# Patient Record
Sex: Female | Born: 1955 | Race: Black or African American | Hispanic: No | Marital: Single | Smoking: Never smoker
Health system: Southern US, Community
[De-identification: ages and names within clinical notes are randomized; demographics above are authoritative.]

---

## 2008-08-02 ENCOUNTER — Emergency Department (HOSPITAL_COMMUNITY): Admission: EM | Admit: 2008-08-02 | Discharge: 2008-08-02 | Payer: Self-pay | Admitting: Emergency Medicine

## 2011-06-13 LAB — BASIC METABOLIC PANEL
Calcium: 8.8
GFR calc Af Amer: >60
GFR calc non Af Amer: >60

## 2011-06-13 LAB — CBC
HCT: 26.7 — ABNORMAL LOW
Hemoglobin: 8.2 — ABNORMAL LOW
MCV: 59.8 — ABNORMAL LOW
RBC: 4.46
WBC: 4.9

## 2011-06-13 LAB — TYPE AND SCREEN
ABO/RH(D): O POS
Antibody Screen: NEGATIVE

## 2011-06-13 LAB — DIFFERENTIAL
Band Neutrophils: 0
Basophils Absolute: 0
Basophils Relative: 0
Blasts: 0
Eosinophils Absolute: 0.3
Monocytes Relative: 8
Promyelocytes Absolute: 0

## 2011-06-13 LAB — PROTIME-INR: Prothrombin Time: 35.1 — ABNORMAL HIGH

## 2019-12-19 ENCOUNTER — Encounter (HOSPITAL_COMMUNITY): Payer: Self-pay

## 2019-12-19 ENCOUNTER — Other Ambulatory Visit: Payer: Self-pay

## 2019-12-19 ENCOUNTER — Emergency Department (HOSPITAL_COMMUNITY): Payer: Medicare HMO

## 2019-12-19 ENCOUNTER — Inpatient Hospital Stay (HOSPITAL_COMMUNITY): Payer: Medicare HMO

## 2019-12-19 ENCOUNTER — Inpatient Hospital Stay (HOSPITAL_COMMUNITY)
Admission: EM | Admit: 2019-12-19 | Discharge: 2019-12-26 | DRG: 083 | Discharge status: Against Medical Advice | Payer: Medicare HMO | Attending: Family Medicine | Admitting: Family Medicine

## 2019-12-19 DIAGNOSIS — D6852 Prothrombin gene mutation: Secondary | ICD-10-CM | POA: Diagnosis present

## 2019-12-19 DIAGNOSIS — J9611 Chronic respiratory failure with hypoxia: Secondary | ICD-10-CM | POA: Diagnosis present

## 2019-12-19 DIAGNOSIS — Z7901 Long term (current) use of anticoagulants: Secondary | ICD-10-CM | POA: Diagnosis not present

## 2019-12-19 DIAGNOSIS — I270 Primary pulmonary hypertension: Secondary | ICD-10-CM | POA: Diagnosis present

## 2019-12-19 DIAGNOSIS — Z9981 Dependence on supplemental oxygen: Secondary | ICD-10-CM | POA: Diagnosis not present

## 2019-12-19 DIAGNOSIS — Z5329 Procedure and treatment not carried out because of patient's decision for other reasons: Secondary | ICD-10-CM | POA: Diagnosis present

## 2019-12-19 DIAGNOSIS — R451 Restlessness and agitation: Secondary | ICD-10-CM | POA: Diagnosis not present

## 2019-12-19 DIAGNOSIS — R4701 Aphasia: Secondary | ICD-10-CM | POA: Diagnosis not present

## 2019-12-19 DIAGNOSIS — R471 Dysarthria and anarthria: Secondary | ICD-10-CM | POA: Diagnosis present

## 2019-12-19 DIAGNOSIS — W19XXXA Unspecified fall, initial encounter: Secondary | ICD-10-CM | POA: Diagnosis present

## 2019-12-19 DIAGNOSIS — Z87891 Personal history of nicotine dependence: Secondary | ICD-10-CM | POA: Diagnosis not present

## 2019-12-19 DIAGNOSIS — D6859 Other primary thrombophilia: Secondary | ICD-10-CM | POA: Diagnosis present

## 2019-12-19 DIAGNOSIS — G4700 Insomnia, unspecified: Secondary | ICD-10-CM | POA: Diagnosis present

## 2019-12-19 DIAGNOSIS — I509 Heart failure, unspecified: Secondary | ICD-10-CM | POA: Diagnosis not present

## 2019-12-19 DIAGNOSIS — I629 Nontraumatic intracranial hemorrhage, unspecified: Secondary | ICD-10-CM

## 2019-12-19 DIAGNOSIS — Z20822 Contact with and (suspected) exposure to covid-19: Secondary | ICD-10-CM | POA: Diagnosis present

## 2019-12-19 DIAGNOSIS — I272 Pulmonary hypertension, unspecified: Secondary | ICD-10-CM | POA: Diagnosis not present

## 2019-12-19 DIAGNOSIS — N189 Chronic kidney disease, unspecified: Secondary | ICD-10-CM | POA: Diagnosis present

## 2019-12-19 DIAGNOSIS — S066X9A Traumatic subarachnoid hemorrhage with loss of consciousness of unspecified duration, initial encounter: Secondary | ICD-10-CM | POA: Diagnosis present

## 2019-12-19 DIAGNOSIS — I4891 Unspecified atrial fibrillation: Secondary | ICD-10-CM | POA: Diagnosis not present

## 2019-12-19 DIAGNOSIS — N179 Acute kidney failure, unspecified: Secondary | ICD-10-CM | POA: Diagnosis not present

## 2019-12-19 DIAGNOSIS — R069 Unspecified abnormalities of breathing: Secondary | ICD-10-CM

## 2019-12-19 DIAGNOSIS — R4689 Other symptoms and signs involving appearance and behavior: Secondary | ICD-10-CM | POA: Diagnosis not present

## 2019-12-19 DIAGNOSIS — G9340 Encephalopathy, unspecified: Secondary | ICD-10-CM

## 2019-12-19 DIAGNOSIS — R270 Ataxia, unspecified: Secondary | ICD-10-CM | POA: Diagnosis present

## 2019-12-19 DIAGNOSIS — S065X9A Traumatic subdural hemorrhage with loss of consciousness of unspecified duration, initial encounter: Principal | ICD-10-CM | POA: Diagnosis present

## 2019-12-19 DIAGNOSIS — E872 Acidosis, unspecified: Secondary | ICD-10-CM

## 2019-12-19 DIAGNOSIS — R29810 Facial weakness: Secondary | ICD-10-CM | POA: Diagnosis not present

## 2019-12-19 DIAGNOSIS — Z781 Physical restraint status: Secondary | ICD-10-CM

## 2019-12-19 DIAGNOSIS — G894 Chronic pain syndrome: Secondary | ICD-10-CM | POA: Diagnosis present

## 2019-12-19 DIAGNOSIS — I959 Hypotension, unspecified: Secondary | ICD-10-CM | POA: Diagnosis not present

## 2019-12-19 DIAGNOSIS — S065XAA Traumatic subdural hemorrhage with loss of consciousness status unknown, initial encounter: Secondary | ICD-10-CM | POA: Diagnosis present

## 2019-12-19 DIAGNOSIS — E049 Nontoxic goiter, unspecified: Secondary | ICD-10-CM | POA: Diagnosis present

## 2019-12-19 DIAGNOSIS — T50905A Adverse effect of unspecified drugs, medicaments and biological substances, initial encounter: Secondary | ICD-10-CM | POA: Diagnosis present

## 2019-12-19 DIAGNOSIS — E876 Hypokalemia: Secondary | ICD-10-CM | POA: Diagnosis not present

## 2019-12-19 LAB — COMPREHENSIVE METABOLIC PANEL
ALT: 9 U/L (ref 0–44)
AST: 15 U/L (ref 15–41)
Albumin: 4 g/dL (ref 3.5–5.0)
Alkaline Phosphatase: 91 U/L (ref 38–126)
Anion gap: 14 (ref 5–15)
BUN: 24 mg/dL — ABNORMAL HIGH (ref 8–23)
CO2: 11 mmol/L — ABNORMAL LOW (ref 22–32)
Calcium: 10.5 mg/dL — ABNORMAL HIGH (ref 8.9–10.3)
Chloride: 109 mmol/L (ref 98–111)
Creatinine, Ser: 1.56 mg/dL — ABNORMAL HIGH (ref 0.44–1.00)
GFR calc Af Amer: 41 mL/min — ABNORMAL LOW (ref >60)
GFR calc non Af Amer: 35 mL/min — ABNORMAL LOW (ref >60)
Glucose, Bld: 115 mg/dL — ABNORMAL HIGH (ref 70–99)
Potassium: 3.7 mmol/L (ref 3.5–5.1)
Sodium: 134 mmol/L — ABNORMAL LOW (ref 135–145)
Total Bilirubin: 0.3 mg/dL (ref 0.3–1.2)
Total Protein: 8.5 g/dL — ABNORMAL HIGH (ref 6.5–8.1)

## 2019-12-19 LAB — URINALYSIS, ROUTINE W REFLEX MICROSCOPIC
Bacteria, UA: NONE SEEN
Bilirubin Urine: NEGATIVE
Glucose, UA: NEGATIVE mg/dL
Ketones, ur: NEGATIVE mg/dL
Leukocytes,Ua: NEGATIVE
Nitrite: NEGATIVE
Protein, ur: 100 mg/dL — AB
Specific Gravity, Urine: 1.014 (ref 1.005–1.030)
pH: 5 (ref 5.0–8.0)

## 2019-12-19 LAB — CBC WITH DIFFERENTIAL/PLATELET
Abs Immature Granulocytes: 0.02 10*3/uL (ref 0.00–0.07)
Basophils Absolute: 0 10*3/uL (ref 0.0–0.1)
Basophils Relative: 1 %
Eosinophils Absolute: 0 10*3/uL (ref 0.0–0.5)
Eosinophils Relative: 0 %
HCT: 40.8 % (ref 36.0–46.0)
Hemoglobin: 12.6 g/dL (ref 12.0–15.0)
Immature Granulocytes: 0 %
Lymphocytes Relative: 25 %
Lymphs Abs: 1.2 10*3/uL (ref 0.7–4.0)
MCH: 23.9 pg — ABNORMAL LOW (ref 26.0–34.0)
MCHC: 30.9 g/dL (ref 30.0–36.0)
MCV: 77.3 fL — ABNORMAL LOW (ref 80.0–100.0)
Monocytes Absolute: 0.3 10*3/uL (ref 0.1–1.0)
Monocytes Relative: 7 %
Neutro Abs: 3.3 10*3/uL (ref 1.7–7.7)
Neutrophils Relative %: 67 %
Platelets: 223 10*3/uL (ref 150–400)
RBC: 5.28 MIL/uL — ABNORMAL HIGH (ref 3.87–5.11)
RDW: 18.1 % — ABNORMAL HIGH (ref 11.5–15.5)
WBC: 5 10*3/uL (ref 4.0–10.5)
nRBC: 0 % (ref 0.0–0.2)

## 2019-12-19 LAB — POCT I-STAT 7, (LYTES, BLD GAS, ICA,H+H)
Acid-base deficit: 12 mmol/L — ABNORMAL HIGH (ref 0.0–2.0)
Bicarbonate: 11.3 mmol/L — ABNORMAL LOW (ref 20.0–28.0)
Calcium, Ion: 1.5 mmol/L — ABNORMAL HIGH (ref 1.15–1.40)
HCT: 36 % (ref 36.0–46.0)
Hemoglobin: 12.2 g/dL (ref 12.0–15.0)
O2 Saturation: 99 %
Potassium: 3.7 mmol/L (ref 3.5–5.1)
Sodium: 137 mmol/L (ref 135–145)
TCO2: 12 mmol/L — ABNORMAL LOW (ref 22–32)
pCO2 arterial: 20.6 mmHg — ABNORMAL LOW (ref 32.0–48.0)
pH, Arterial: 7.348 — ABNORMAL LOW (ref 7.350–7.450)
pO2, Arterial: 120 mmHg — ABNORMAL HIGH (ref 83.0–108.0)

## 2019-12-19 LAB — RAPID URINE DRUG SCREEN, HOSP PERFORMED
Amphetamines: NOT DETECTED
Barbiturates: NOT DETECTED
Benzodiazepines: POSITIVE — AB
Cocaine: NOT DETECTED
Opiates: NOT DETECTED
Tetrahydrocannabinol: NOT DETECTED

## 2019-12-19 LAB — C DIFFICILE QUICK SCREEN W PCR REFLEX
C Diff antigen: NEGATIVE
C Diff interpretation: NOT DETECTED
C Diff toxin: NEGATIVE

## 2019-12-19 LAB — APTT: aPTT: 91 seconds — ABNORMAL HIGH (ref 24–36)

## 2019-12-19 LAB — LACTIC ACID, PLASMA: Lactic Acid, Venous: 1 mmol/L (ref 0.5–1.9)

## 2019-12-19 LAB — ETHANOL: Alcohol, Ethyl (B): <10 mg/dL (ref <10)

## 2019-12-19 LAB — PROTIME-INR
INR: 5.6 (ref 0.8–1.2)
INR: 1.4 — ABNORMAL HIGH (ref 0.8–1.2)
Prothrombin Time: 51 seconds — ABNORMAL HIGH (ref 11.4–15.2)
Prothrombin Time: 17.3 seconds — ABNORMAL HIGH (ref 11.4–15.2)

## 2019-12-19 LAB — RESPIRATORY PANEL BY RT PCR (FLU A&B, COVID)
Influenza A by PCR: NEGATIVE
Influenza B by PCR: NEGATIVE
SARS Coronavirus 2 by RT PCR: NEGATIVE

## 2019-12-19 LAB — TSH: TSH: 0.449 u[IU]/mL (ref 0.350–4.500)

## 2019-12-19 LAB — SALICYLATE LEVEL: Salicylate Lvl: <7 mg/dL — ABNORMAL LOW (ref 7.0–30.0)

## 2019-12-19 LAB — AMMONIA: Ammonia: 39 umol/L — ABNORMAL HIGH (ref 9–35)

## 2019-12-19 LAB — CK: Total CK: 181 U/L (ref 38–234)

## 2019-12-19 LAB — ACETAMINOPHEN LEVEL: Acetaminophen (Tylenol), Serum: <10 ug/mL — ABNORMAL LOW (ref 10–30)

## 2019-12-19 LAB — CBG MONITORING, ED: Glucose-Capillary: 104 mg/dL — ABNORMAL HIGH (ref 70–99)

## 2019-12-19 LAB — MRSA PCR SCREENING: MRSA by PCR: NEGATIVE

## 2019-12-19 LAB — T4, FREE: Free T4: 1.16 ng/dL — ABNORMAL HIGH (ref 0.61–1.12)

## 2019-12-19 LAB — TROPONIN I (HIGH SENSITIVITY): Troponin I (High Sensitivity): 3 ng/L (ref <18)

## 2019-12-19 MED ORDER — MAGNESIUM GLUCONATE 500 MG PO TABS
500.0000 mg | ORAL_TABLET | Freq: Two times a day (BID) | ORAL | Status: DC
  Administered 2019-12-20 – 2019-12-25 (×10): 500 mg via ORAL
  Filled 2019-12-19 (×16): qty 1

## 2019-12-19 MED ORDER — HYDROCORT-PRAMOXINE (PERIANAL) 1-1 % EX FOAM
1.0000 | Freq: Two times a day (BID) | CUTANEOUS | Status: DC
  Administered 2019-12-19 – 2019-12-25 (×11): 1 via RECTAL
  Filled 2019-12-19 (×2): qty 10

## 2019-12-19 MED ORDER — KETAMINE HCL 50 MG/5ML IJ SOSY: 0.5000 mg/kg | PREFILLED_SYRINGE | Freq: Once | INTRAMUSCULAR | Status: AC

## 2019-12-19 MED ORDER — POTASSIUM CHLORIDE CRYS ER 20 MEQ PO TBCR
40.0000 meq | EXTENDED_RELEASE_TABLET | Freq: Two times a day (BID) | ORAL | Status: DC
  Administered 2019-12-20 – 2019-12-25 (×11): 40 meq via ORAL
  Filled 2019-12-19 (×14): qty 2

## 2019-12-19 MED ORDER — LORAZEPAM 2 MG/ML IJ SOLN
1.0000 mg | Freq: Once | INTRAMUSCULAR | Status: AC
  Administered 2019-12-19: 1 mg via INTRAVENOUS
  Filled 2019-12-19 (×3): qty 1

## 2019-12-19 MED ORDER — MIDAZOLAM HCL 2 MG/2ML IJ SOLN
INTRAMUSCULAR | Status: AC
  Filled 2019-12-19: qty 6

## 2019-12-19 MED ORDER — STERILE WATER FOR INJECTION IJ SOLN
INTRAMUSCULAR | Status: AC
  Filled 2019-12-19: qty 10

## 2019-12-19 MED ORDER — DEXMEDETOMIDINE HCL IN NACL 400 MCG/100ML IV SOLN
0.4000 ug/kg/h | INTRAVENOUS | Status: DC
  Administered 2019-12-19: 0.5 ug/kg/h via INTRAVENOUS
  Administered 2019-12-19: 0.4 ug/kg/h via INTRAVENOUS
  Administered 2019-12-20: 1.4 ug/kg/h via INTRAVENOUS
  Administered 2019-12-20: 0.4 ug/kg/h via INTRAVENOUS
  Administered 2019-12-20: 1.4 ug/kg/h via INTRAVENOUS
  Administered 2019-12-20: 0.7 ug/kg/h via INTRAVENOUS
  Administered 2019-12-21: 1.1 ug/kg/h via INTRAVENOUS
  Filled 2019-12-19 (×2): qty 300
  Filled 2019-12-19: qty 100
  Filled 2019-12-19: qty 200
  Filled 2019-12-19: qty 100

## 2019-12-19 MED ORDER — TADALAFIL 20 MG PO TABS
40.0000 mg | ORAL_TABLET | Freq: Every day | ORAL | Status: DC
  Administered 2019-12-20 – 2019-12-25 (×6): 40 mg via ORAL
  Filled 2019-12-19 (×8): qty 2

## 2019-12-19 MED ORDER — ZIPRASIDONE MESYLATE 20 MG IM SOLR
INTRAMUSCULAR | Status: AC
  Filled 2019-12-19: qty 20

## 2019-12-19 MED ORDER — MIDAZOLAM HCL 2 MG/2ML IJ SOLN
1.0000 mg | INTRAMUSCULAR | Status: DC | PRN
  Administered 2019-12-19 – 2019-12-26 (×7): 1 mg via INTRAVENOUS
  Filled 2019-12-19 (×7): qty 2

## 2019-12-19 MED ORDER — LORAZEPAM 2 MG/ML IJ SOLN
INTRAMUSCULAR | Status: AC
  Filled 2019-12-19: qty 1

## 2019-12-19 MED ORDER — POLYETHYLENE GLYCOL 3350 17 G PO PACK: 17.0000 g | PACK | Freq: Every day | ORAL | Status: DC | PRN

## 2019-12-19 MED ORDER — DICYCLOMINE HCL 10 MG PO CAPS
10.0000 mg | ORAL_CAPSULE | Freq: Three times a day (TID) | ORAL | Status: DC
  Administered 2019-12-20 – 2019-12-26 (×20): 10 mg via ORAL
  Filled 2019-12-19 (×28): qty 1

## 2019-12-19 MED ORDER — FUROSEMIDE 40 MG PO TABS
80.0000 mg | ORAL_TABLET | Freq: Two times a day (BID) | ORAL | Status: DC
  Administered 2019-12-20 – 2019-12-22 (×4): 80 mg via ORAL
  Filled 2019-12-19 (×4): qty 2

## 2019-12-19 MED ORDER — SODIUM BICARBONATE 650 MG PO TABS
650.0000 mg | ORAL_TABLET | Freq: Two times a day (BID) | ORAL | Status: DC
  Administered 2019-12-20 – 2019-12-25 (×11): 650 mg via ORAL
  Filled 2019-12-19 (×16): qty 1

## 2019-12-19 MED ORDER — DOCUSATE SODIUM 100 MG PO CAPS: 100.0000 mg | ORAL_CAPSULE | Freq: Two times a day (BID) | ORAL | Status: DC | PRN

## 2019-12-19 MED ORDER — MIDAZOLAM HCL 5 MG/5ML IJ SOLN: 5.0000 mg | Freq: Once | INTRAMUSCULAR | Status: DC

## 2019-12-19 MED ORDER — LORAZEPAM 2 MG/ML IJ SOLN: 1.0000 mg | Freq: Two times a day (BID) | INTRAMUSCULAR | Status: DC | PRN

## 2019-12-19 MED ORDER — LORAZEPAM 2 MG/ML IJ SOLN
1.0000 mg | Freq: Two times a day (BID) | INTRAMUSCULAR | Status: DC | PRN
  Administered 2019-12-19 – 2019-12-24 (×8): 1 mg via INTRAVENOUS
  Filled 2019-12-19 (×9): qty 1

## 2019-12-19 MED ORDER — MIDAZOLAM HCL 2 MG/2ML IJ SOLN
2.0000 mg | Freq: Once | INTRAMUSCULAR | Status: AC
  Administered 2019-12-19: 2 mg via INTRAMUSCULAR

## 2019-12-19 MED ORDER — SODIUM CHLORIDE 0.9 % IV SOLN: INTRAVENOUS | Status: DC

## 2019-12-19 MED ORDER — BACID PO TABS
2.0000 | ORAL_TABLET | Freq: Two times a day (BID) | ORAL | Status: DC
  Administered 2019-12-20 – 2019-12-26 (×12): 2 via ORAL
  Filled 2019-12-19 (×15): qty 2

## 2019-12-19 MED ORDER — GABAPENTIN 400 MG PO CAPS
800.0000 mg | ORAL_CAPSULE | Freq: Three times a day (TID) | ORAL | Status: DC
  Filled 2019-12-19 (×2): qty 2

## 2019-12-19 MED ORDER — SODIUM CHLORIDE 0.9 % IV BOLUS
1000.0000 mL | Freq: Once | INTRAVENOUS | Status: AC
  Administered 2019-12-19: 1000 mL via INTRAVENOUS

## 2019-12-19 MED ORDER — ORAL CARE MOUTH RINSE
15.0000 mL | Freq: Two times a day (BID) | OROMUCOSAL | Status: DC
  Administered 2019-12-19 – 2019-12-25 (×13): 15 mL via OROMUCOSAL

## 2019-12-19 MED ORDER — KETAMINE HCL 50 MG/5ML IJ SOSY
PREFILLED_SYRINGE | INTRAMUSCULAR | Status: AC
  Administered 2019-12-19: 40 mg via INTRAVENOUS
  Filled 2019-12-19: qty 5

## 2019-12-19 MED ORDER — BOSENTAN 125 MG PO TABS
125.0000 mg | ORAL_TABLET | Freq: Two times a day (BID) | ORAL | Status: DC
  Administered 2019-12-20 – 2019-12-23 (×7): 125 mg via ORAL
  Filled 2019-12-19 (×5): qty 1

## 2019-12-19 MED ORDER — LORAZEPAM 2 MG/ML IJ SOLN
1.0000 mg | Freq: Once | INTRAMUSCULAR | Status: AC
  Administered 2019-12-19: 1 mg via INTRAVENOUS

## 2019-12-19 MED ORDER — EPOPROSTENOL SODIUM 1.5 MG IV SOLR
45.5000 ng/kg/min | INTRAVENOUS | Status: DC
  Administered 2019-12-19: 45.5 ng/kg/min via INTRAVENOUS

## 2019-12-19 MED ORDER — VITAMIN D 25 MCG (1000 UNIT) PO TABS
1000.0000 [IU] | ORAL_TABLET | Freq: Every day | ORAL | Status: DC
  Administered 2019-12-21 – 2019-12-24 (×4): 1000 [IU] via ORAL
  Filled 2019-12-19 (×6): qty 1

## 2019-12-19 MED ORDER — FERROUS SULFATE 325 (65 FE) MG PO TABS
325.0000 mg | ORAL_TABLET | Freq: Every day | ORAL | Status: DC
  Administered 2019-12-21 – 2019-12-24 (×4): 325 mg via ORAL
  Filled 2019-12-19 (×6): qty 1

## 2019-12-19 MED ORDER — LORAZEPAM 1 MG PO TABS
1.0000 mg | ORAL_TABLET | Freq: Two times a day (BID) | ORAL | Status: DC | PRN
  Administered 2019-12-25 (×2): 1 mg via ORAL
  Filled 2019-12-19 (×3): qty 1

## 2019-12-19 MED ORDER — PROTHROMBIN COMPLEX CONC HUMAN 500 UNITS IV KIT
1676.0000 [IU] | PACK | Status: AC
  Administered 2019-12-19: 1676 [IU] via INTRAVENOUS
  Filled 2019-12-19: qty 528

## 2019-12-19 MED ORDER — SODIUM CHLORIDE 3 % IV SOLN
INTRAVENOUS | Status: DC
  Administered 2019-12-19 – 2019-12-21 (×6): 75 mL/h via INTRAVENOUS
  Filled 2019-12-19 (×10): qty 500

## 2019-12-19 MED ORDER — KETAMINE HCL 50 MG/5ML IJ SOSY
PREFILLED_SYRINGE | INTRAMUSCULAR | Status: AC
  Filled 2019-12-19: qty 10

## 2019-12-19 MED ORDER — NALOXONE HCL 2 MG/2ML IJ SOSY
PREFILLED_SYRINGE | INTRAMUSCULAR | Status: AC
  Filled 2019-12-19: qty 2

## 2019-12-19 MED ORDER — SPIRONOLACTONE 100 MG PO TABS
100.0000 mg | ORAL_TABLET | Freq: Two times a day (BID) | ORAL | Status: DC
  Administered 2019-12-20 – 2019-12-21 (×4): 100 mg via ORAL
  Filled 2019-12-19 (×8): qty 1

## 2019-12-19 MED ORDER — HALOPERIDOL LACTATE 5 MG/ML IJ SOLN
5.0000 mg | Freq: Once | INTRAMUSCULAR | Status: AC
  Administered 2019-12-19: 5 mg via INTRAMUSCULAR
  Filled 2019-12-19: qty 1

## 2019-12-19 MED ORDER — ADULT MULTIVITAMIN W/MINERALS CH
1.0000 | ORAL_TABLET | Freq: Every day | ORAL | Status: DC
  Administered 2019-12-21 – 2019-12-24 (×4): 1 via ORAL
  Filled 2019-12-19 (×6): qty 1

## 2019-12-19 MED ORDER — CHLORHEXIDINE GLUCONATE CLOTH 2 % EX PADS
6.0000 | MEDICATED_PAD | Freq: Every day | CUTANEOUS | Status: DC
  Administered 2019-12-19 – 2019-12-24 (×6): 6 via TOPICAL

## 2019-12-19 MED ORDER — LORAZEPAM 1 MG PO TABS: 1.0000 mg | ORAL_TABLET | Freq: Two times a day (BID) | ORAL | Status: DC | PRN

## 2019-12-19 MED ORDER — VITAMIN K1 10 MG/ML IJ SOLN
10.0000 mg | INTRAVENOUS | Status: AC
  Administered 2019-12-19: 10 mg via INTRAVENOUS
  Filled 2019-12-19: qty 1

## 2019-12-19 MED ORDER — LORAZEPAM 2 MG/ML IJ SOLN: 1.0000 mg | Freq: Once | INTRAMUSCULAR | Status: DC

## 2019-12-19 MED ORDER — EPOPROSTENOL SODIUM 1.5 MG IV SOLR
6000.0000 ug | INTRAVENOUS | Status: DC
  Filled 2019-12-19: qty 20

## 2019-12-19 MED ORDER — BOSENTAN 125 MG PO TABS
125.0000 mg | ORAL_TABLET | Freq: Two times a day (BID) | ORAL | Status: DC
  Filled 2019-12-19 (×3): qty 1

## 2019-12-19 MED ORDER — DIPHENOXYLATE-ATROPINE 2.5-0.025 MG PO TABS: 1.0000 | ORAL_TABLET | Freq: Four times a day (QID) | ORAL | Status: DC | PRN

## 2019-12-19 MED ORDER — NALOXONE HCL 2 MG/2ML IJ SOSY: 1.0000 mg | PREFILLED_SYRINGE | Freq: Once | INTRAMUSCULAR | Status: DC

## 2019-12-19 MED ORDER — ZIPRASIDONE MESYLATE 20 MG IM SOLR
10.0000 mg | Freq: Once | INTRAMUSCULAR | Status: AC
  Administered 2019-12-19: 10 mg via INTRAMUSCULAR

## 2019-12-19 MED ORDER — DIPHENOXYLATE-ATROPINE 2.5-0.025 MG/5ML PO LIQD: 5.0000 mL | Freq: Four times a day (QID) | ORAL | Status: DC | PRN

## 2019-12-19 MED ORDER — MIDAZOLAM HCL 2 MG/2ML IJ SOLN
INTRAMUSCULAR | Status: AC
  Administered 2019-12-19: 1 mg
  Filled 2019-12-19: qty 2

## 2019-12-19 MED ORDER — LORAZEPAM 2 MG/ML IJ SOLN
2.0000 mg | Freq: Once | INTRAMUSCULAR | Status: AC
  Administered 2019-12-19: 2 mg via INTRAMUSCULAR
  Filled 2019-12-19: qty 1

## 2019-12-19 MED ORDER — MIDAZOLAM HCL 2 MG/2ML IJ SOLN
INTRAMUSCULAR | Status: AC
  Filled 2019-12-19: qty 2

## 2019-12-19 MED ORDER — BUSPIRONE HCL 10 MG PO TABS
10.0000 mg | ORAL_TABLET | Freq: Two times a day (BID) | ORAL | Status: DC
  Administered 2019-12-20 – 2019-12-26 (×12): 10 mg via ORAL
  Filled 2019-12-19 (×15): qty 1

## 2019-12-19 MED ORDER — TEMAZEPAM 15 MG PO CAPS
30.0000 mg | ORAL_CAPSULE | Freq: Every day | ORAL | Status: DC
  Administered 2019-12-20 – 2019-12-25 (×6): 30 mg via ORAL
  Filled 2019-12-19 (×8): qty 2

## 2019-12-19 NOTE — ED Provider Notes (Addendum)
TIME SEEN: 4:38 AM  CHIEF COMPLAINT: AMS  HPI: Patient is a 64 year old female with unknown past medical history who presents to the emergency department with EMS after she was found knocking on doors at a hotel tonight.  Patient has bruises to the left thigh and bilateral shoulders.  She is unable to provide [place] any history.  Blood sugar normal with EMS.  Vitals normal with EMS.  Patient currently has her ID.  She has an effusion of Veletri running through a port in her left chest.   When asked questions by nursing staff, patient states that her mother is hurting her.  When asked how old her mother is she states "she is 71 years old".  She makes other comments such as "I can't wait to get your ass" to no one in particular.   ROS: Level 5 caveat for altered mental status  PAST MEDICAL HISTORY/PAST SURGICAL HISTORY:  History reviewed. No pertinent past medical history.  MEDICATIONS:  Prior to Admission medications   Not on File    ALLERGIES:  Not on File  SOCIAL HISTORY:  Social History   Tobacco Use  . Smoking status: Never Smoker  . Smokeless tobacco: Never Used  Substance Use Topics  . Alcohol use: Yes    FAMILY HISTORY: History reviewed. No pertinent family history.  EXAM: BP 106/77 (BP Location: Left Arm)   Pulse (!) 101   Temp 97.7 F (36.5 C) (Oral)   Resp 20   Ht 5\' 8"  (1.727 m)   Wt 77.1 kg   SpO2 98%   BMI 25.85 kg/m  CONSTITUTIONAL: Alert and oriented to person only.  Initially seemed sedated with slurred speech but then quickly became very aggressive, fighting staff, threatening to hit, bite and kill staff members, appeared to have excited delirium HEAD: Normocephalic; bruising to the left upper eyelid without significant periorbital swelling EYES: Conjunctivae clear, PERRL, EOMI, no subconjunctival hemorrhage or hyphema ENT: normal nose; no rhinorrhea; moist mucous membranes; pharynx without lesions noted; no dental injury; no septal hematoma NECK: Supple,  no meningismus, no LAD; no midline spinal tenderness, step-off or deformity; trachea midline, patient has thyromegaly CARD: Regular and tachycardic; S1 and S2 appreciated; no murmurs, no clicks, no rubs, no gallops RESP: Normal chest excursion without splinting or tachypnea; breath sounds clear and equal bilaterally; no wheezes, no rhonchi, no rales; no hypoxia or respiratory distress CHEST:  chest wall stable, no crepitus or ecchymosis or deformity, nontender to palpation; no flail chest.  Port in the left chest wall without surrounding redness, warmth, soft tissue swelling, ecchymosis. ABD/GI: Normal bowel sounds; non-distended; soft, non-tender, no rebound, no guarding; no ecchymosis or other lesions noted, no hepatosplenomegaly noted PELVIS:  stable, nontender to palpation BACK:  The back appears normal and is non-tender to palpation, there is no CVA tenderness; no midline spinal tenderness, step-off or deformity EXT: Bruises noted to both shoulders but no deformity of the bones and no loss of fullness of the joints.  Otherwise extremities appear normal without deformity, soft tissue swelling, joint effusion.  Compartments are soft.  2+ radial and DP pulses bilaterally. SKIN: Normal color for age and race; warm NEURO: Moves all extremities equally, slurred speech, oriented to person only, agitated, unable to be redirected PSYCH: Very agitated.  Threatening to kill staff.  Trying to get out of bed.  MEDICAL DECISION MAKING: Patient here with encephalopathy.  Has multiple bruises on exam.  Unclear how she obtain these injuries.  Staff currently working on trying to  get in touch with family members as it does appear she has a chart in epic.  No family at bedside currently.  She was found alone knocking on doors at a hotel.  It is unclear if she was staying there.   When attempting to get IV access and blood, patient becomes extremely agitated and threatening to injure staff members.  She is trying to  hit and bite.  Unable to be redirected.  Will place in soft wrist and ankle restraints and give Haldol and Ativan for patient and staff safety.  Confirmed that patient currently has Veletri running through her chronic confusion.  Pharmacist Marya Amsler has evaluated.  Back appears full and pump appears to be working appropriately.  Blood glucose normal.  Rectal temp normal.  EKG independently reviewed/interpreted and shows sinus tachycardia without arrhythmia, interval changes or ischemia.  Differential includes polypharmacy, drug or alcohol abuse, hypercarbia, intracranial hemorrhage, infection, dehydration, electrolyte derangement, hepatic encephalopathy.  ED PROGRESS:    4:55 AM  Registration able to get in touch with patient's husband.  Spoke with husband, Milbert Coulter by phone at [phone].  He is currently in [place].  He states that is where he and his wife live.  He states that his stepdaughter and wife left this morning to go to [place] to see her doctor in [place] that she has seen for the past 8 years.  She did previously live in [place].  He states the patient has history of pulmonary hypertension and wears 2 to 3 L of oxygen chronically.  He states that these episodes of altered mental status happen whenever she gets "overexerted".  He states this also happens when she does not wear her oxygen.  He will fax over a medication and medical history list.  He will get in touch with patient's daughter Lorriane Shire to let her know that she is here in the emergency department.  5:00 AM  Spoke with daughter, Lorriane Shire at [phone].  She confirms that she was staying in the hotel room with the patient and was not aware that she got up and left.  She will come here to the emergency department.  Daughter reports that the patient has had previous history of alcohol use but quit drinking 20 years ago.  No history of drug abuse.  Daughter reports that she has these episodes frequently and confirms story from  patient's husband.  She states that she thinks that the patient has underlying dementia and or psychiatric illness that has been undiagnosed.  She is only on Ativan for sedation at home.  Daughter reports that she has been agitated like this for the past 6 days but this is "the worst I have ever seen her".  She reports that the patient has had some vomiting and diarrhea this week but has not had any episodes in the past 24 hours.  She did recently have C. difficile and completed treatment in December 2020.  Reports that C. difficile was after being on antibiotics for a prolonged period of time due to line infections.  She has not been on antibiotics in several months.  7:00 AM  Pt requiring multiple rounds of sedation and still fighting staff members.  We have been able to obtain 1 peripheral IV and some blood.  Working on second peripheral IV.  Unable to get a CT scan at this time.  Blood gas shows metabolic acidosis.  No hypercapnia.  Unclear etiology for her metabolic acidosis.  Patient's daughter at bedside attempting to  reassure patient.  7:30 AM  Pt ultimately given 5 mg of IM Haldol, 2 mg of IM Ativan, 10 mg of IM Geodon without any relief.  Then provided 2 mg of IV Ativan with minimal relief.  Given IV 20 mg ketamine x 2 with daughter's verbal permission given multiple staff members are having to hold patient down for safety at this time.  Ketamine seemed to help only for a brief period of time.  Will start IV Precedex infusion.  Daughter would prefer her not to be intubated and sedated given she states she was told "if she was ever intubated that she would not come off the ventilator".  Labs currently pending.  X-ray of the chest has been reviewed/interpreted and shows no acute abnormality.  She does have a 19 mm nodular density overlying the right infrahilar region likely to be vascular in nature but nodule/mass could have similar appearance.  CT scan can be obtained inpatient as needed.  X-ray  of bilateral shoulder shows no injury.   8:05 AM  Pt's labs reviewed/interpreted.  She has a metabolic acidosis with unclear etiology.  Her creatinine is mildly elevated to 1.56 and her BUN is 24.  Blood glucose is 115.  Ethanol level unremarkable.  We will add on toxic alcohol panel.  Ammonia minimally elevated at 39.  Lactate normal.  Troponin negative.  Tylenol and salicylate levels negative.  CK normal.  Coags, thyroid function studies, CT scans, urine, rapid Covid pending.  Signed out to oncoming physician Dr. Lockie Mola to follow-up on these results and admit to ICU.   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  She is supposed to be on bicarb but has not been taking it.  On review of outside records, it appears in November 2020 patient has a creatinine of 1.36 and a bicarb of 16.  I reviewed all nursing notes and pertinent previous records as available.  I have interpreted any EKGs, lab and urine results, imaging (as available).   EKG Interpretation  Date/Time:  Saturday December 19 2019 04:30:38 EDT Ventricular Rate:  101 PR Interval:    QRS Duration: 90 QT Interval:  325 QTC Calculation: 422 R Axis:   -13 Text Interpretation: Sinus tachycardia Consider left atrial enlargement ST elevation, consider inferior injury No old tracing to compare Confirmed by Talina Pleitez, Baxter Hire [phone]) on 12/19/2019 4:38:58 AM       CRITICAL CARE Performed by: Baxter Hire Mckynlie Vanderslice   Total critical care time: 75 minutes  Critical care time was exclusive of separately billable procedures and treating other patients.  Critical care was necessary to treat or prevent imminent or life-threatening deterioration.  Critical care was time spent personally by me on the following activities: development of treatment plan with patient and/or surrogate as well as nursing, discussions with consultants, evaluation of patient's response to treatment, examination of patient,  obtaining history from patient or surrogate, ordering and performing treatments and interventions, ordering and review of laboratory studies, ordering and review of radiographic studies, pulse oximetry and re-evaluation of patient's condition.  RAMONICA GRIGG was evaluated in Emergency Department on 12/19/2019 for the symptoms described in the history of present illness. She was evaluated in the context of the global COVID-19 pandemic, which necessitated consideration that the patient might be at risk for infection with the SARS-CoV-2 virus that causes COVID-19. Institutional protocols and algorithms that pertain to the evaluation of patients at risk for COVID-19 are in a state  of rapid change based on information released by regulatory bodies including the CDC and federal and state organizations. These policies and algorithms were followed during the patient's care in the ED.       Mckensi Redinger, Layla Maw, DO 12/19/19 0814    Fidel Caggiano, Layla Maw, DO 12/19/19 [phone]

## 2019-12-19 NOTE — Consult Note (Signed)
Chief Complaint   Chief Complaint  Patient presents with  . Altered Mental Status    History of Present Illness  Elizabeth Shepherd is a 64 y.o. woman with pulmonary HTN, Afib on warfarin brought to the hospital for altered mental status.  CT head showed a left temporal lobe hemorrhage and associated SDH over convexity and some small interhemispheric hygroma.  She received a large amount of sedation last night and reportedly has had significantly improving mental status over the past hour after holding the precedex.  INR was 5.7 so she was given [place].  Past Medical History  History reviewed. No pertinent past medical history.  Past Surgical History  History reviewed. No pertinent surgical history.  Social History   Social History   Tobacco Use  . Smoking status: Never Smoker  . Smokeless tobacco: Never Used  Substance Use Topics  . Alcohol use: Yes  . Drug use: Yes    Comment: pt unable to state which drug    Medications   Prior to Admission medications   Medication Sig Start Date End Date Taking? Authorizing Provider  bosentan (TRACLEER) 125 MG tablet Take 125 mg by mouth 2 (two) times daily.   Yes [provider]  busPIRone (BUSPAR) 10 MG tablet Take 10 mg by mouth 2 (two) times daily.   Yes [provider]  cholecalciferol (VITAMIN D3) 25 MCG (1000 UNIT) tablet Take 1,000 Units by mouth daily.   Yes [provider]  dicyclomine (BENTYL) 10 MG capsule Take 10 mg by mouth 4 (four) times daily -  before meals and at bedtime.   Yes [provider]  diphenoxylate-atropine (LOMOTIL) 2.5-0.025 MG tablet Take 1 tablet by mouth 4 (four) times daily as needed for diarrhea or loose stools.   Yes [provider]  epoprostenol (VELETRI) 1.5 MG injection Inject into the vein. 4 vials water. Cassette. 1am.   Yes [provider]  ferrous sulfate 325 (65 FE) MG tablet Take 325 mg by mouth daily with breakfast.   Yes [provider]  furosemide (LASIX) 80 MG tablet Take 80 mg by mouth 2 (two) times daily.   Yes [provider]  gabapentin (NEURONTIN) 800 MG tablet Take 800 mg by mouth 3 (three) times daily.   Yes [provider]  hydrocortisone-pramoxine (PROCTOFOAM-HC) rectal foam Place 1 applicator rectally 2 (two) times daily.   Yes [provider]  lactobacillus acidophilus (BACID) TABS tablet Take 2 tablets by mouth 2 (two) times daily.   Yes [provider]  LORazepam (ATIVAN) 1 MG tablet Take 1 mg by mouth 2 (two) times daily as needed for anxiety.   Yes [provider]  magnesium gluconate (MAGONATE) 500 MG tablet Take 500 mg by mouth 2 (two) times daily.   Yes [provider]  Multiple Vitamins-Minerals (MULTIVITAMIN WITH MINERALS) tablet Take 1 tablet by mouth daily.   Yes [provider]  OXYGEN Inhale 3 L into the lungs continuous.   Yes [provider]  potassium chloride (KLOR-CON) 10 MEQ tablet Take 40 mEq by mouth 2 (two) times daily.   Yes [provider]  sodium bicarbonate 650 MG tablet Take 650 mg by mouth 2 (two) times daily.   Yes [provider]  spironolactone (ALDACTONE) 100 MG tablet Take 100 mg by mouth 2 (two) times daily.   Yes [provider]  temazepam (RESTORIL) 30 MG capsule Take 30 mg by mouth at bedtime.   Yes [provider]  warfarin (COUMADIN) 10 MG tablet Take 10 mg by mouth daily.   Yes [provider]    Allergies   Allergies  Allergen Reactions  . Codeine Rash    Review of Systems  ROS  Neurologic Exam  Eyes open to stim, PERRL Oriented to name only.  Numerous semantic paraphasias. FC x 4.  Right pronator drift but can squeeze hand Some receptive aphasia  Imaging  CT head reviewed.  Left temporal lobe hematoma ~ 2 cm that appears to have extended into the subdural space. Subdural hematoma irregular in thickness.  There is ~3-4 mm midline  shift.  Impression  - 64 y.o. woman with left temporal hematoma and associated SDH, likely from coagulopathy, though hemorrhagic conversion of stroke or hypertensive etiology also possible.  Of note, patient has a DNI order.  Of note, there is a small bur hole seen on the left side of the skull.  Plan  - no neurosurgical intervention at this time.  Planning for a repeat CT head today.  Need INR check to make sure normalized.  Will clarify with family code status.  In event of neurologic decline, large craniotomy/hemicraniectomy can be performed but given patient's medical issues and airway history, high probability of trach/PEG and poor functional outcome in that situation.

## 2019-12-19 NOTE — ED Notes (Signed)
Pt. Is very combative will not let you stick for labs pt has refused

## 2019-12-19 NOTE — ED Notes (Signed)
Multiple unsuccesful attempts from this RN and others to start IV placement/draw labs.

## 2019-12-19 NOTE — ED Triage Notes (Signed)
Pt arrives for AMS. Pt found walking around hotel knocking on people's door. Pt has PICC in place, unsure of where it's from and why it's there. Pt only states name and DOB. CBG 114.  BP: 139/81, HR 102, Spo2 100% RA.

## 2019-12-19 NOTE — ED Notes (Signed)
Pt in four point soft restraints on this writer's entry into room. No report from previous nurse of time applied or order present. MD aware of need for restraint order.

## 2019-12-19 NOTE — Progress Notes (Signed)
eLink Physician-Brief Progress Note Patient Name: Elizabeth Shepherd DOB: 09-Feb-1956 MRN: [id number]   Date of Service  12/19/2019  HPI/Events of Note  Pt delirious and not taking PO.  eICU Interventions  Order for NG tube placed for medication and feeding access.        Thomasene Lot Ioanna Colquhoun 12/19/2019, 11:38 PM

## 2019-12-19 NOTE — ED Notes (Addendum)
Verbal order for additional 20mg  of Ketamine IV per MDWard at bedside. Total of 40mg  given.

## 2019-12-19 NOTE — ED Provider Notes (Signed)
Patient handed off to me at shift change.  Patient presents with altered mental status.  Has some agitation, difficult to control.  Multiple sedation medications have been administered including Haldol, Geodon, Ativan, ketamine with not much improvement.  Patient has now been started on a Precedex drip.  Awaiting head CT.  Patient with bicarb of 11 but normal pH.  Has chronic pulmonary hypertension.  Patient on prostaglandin infusion for this.  On chronic oxygen.  Neurologically she appears intact but she has bruising throughout her body.  She is on Coumadin.  INR not back yet.  Ethanol level is normal.  Toxic alcohol levels have been sent for.  Patient does have a history of acidosis and is supposed to be on bicarb per daughter.  Upon my evaluation patient is still agitated.  IV has infiltrated and Precedex has not been running.  At this time INR has come back and is elevated at 5.7.  Given agitation, elevated INR and bruising efforts now have been made to more emergently get her to head CT.  Previous care provider talked to family about intubating her in order to get this done as they were failing to get patient sedated.  However patient has history of difficult airway and pulmonary disease and family does not want patient intubated unless it is absolutely necessary.    Therefore, patient was given a dose IV Versed restarted on Precedex and went directly to the CT scan in which it was seen that patient had intracranial bleed.  I talked with both radiology and neurosurgery on the phone.  Head CT shows moderate acute subdural hematoma with also bulbous temporal region bleed as well.  There is mild shift.  Patient has a history of subdural in this area according to family member.  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  Will have neurology evaluate and review imaging as well.  At this time all sedation has been stopped.  Patient still moves all  extremities, localizes the pain.  Pupils little bit asymmetric.  At this time believe we can hold off on intubation at this time.  Family prefers this however they are willing to have patient intubated if needed and patient will remain full code.  Medical ICU called to admit the patient given pulmonary history and difficult intubation history and believe she would do best in ICU unit with pulmonary care.  Patient admitted to their service.  This chart was dictated using voice recognition software.  Despite best efforts to proofread,  errors can occur which can change the documentation meaning.   .Critical Care Performed by: Lennice Sites, DO Authorized by: Lennice Sites, DO   Critical care provider statement:    Critical care time (minutes):  47   Critical care was necessary to treat or prevent imminent or life-threatening deterioration of the following conditions:  CNS failure or compromise   Critical care was time spent personally by me on the following activities:  Development of treatment plan with patient or surrogate, blood draw for specimens, discussions with consultants, discussions with primary provider, examination of patient, evaluation of patient's response to treatment, obtaining history from patient or surrogate, ordering and performing treatments and interventions, ordering and review of laboratory studies, ordering and review of radiographic studies, pulse oximetry, re-evaluation of patient's condition and review of old charts   I assumed direction of critical care for this patient from another provider in my specialty: no  Virgina Norfolk, DO 12/19/19 1121

## 2019-12-19 NOTE — ED Notes (Signed)
Pt still very combative at this time. Screaming cursing at staff. Daughhter at bedside. IV team at bedside, unsuccesful [place] IV.

## 2019-12-19 NOTE — Progress Notes (Addendum)
At about 1500 pt refused lab draw and became agitated/combative.   1600 attempted to give meds and pt became agitated and combative even w/ ativan given

## 2019-12-19 NOTE — Progress Notes (Signed)
Pharmacy reported to bedside to evaluate pulmonary hypertension medications.   Patient is on:  Bosentan (Tracleer) 125 mg PO BID  Tadalafil (Alyq/Advirca) 40 mg daily at bedtime  Epoprostenol (Veletri) 60,000 ng/mL (100 mL) - running at 94 mL/day; also verified with [facility] (dose wt 86 kg, 45.5 ng/kg/min).   All medications were verified with nursing and patient daughter at bedside.   Cassette changed tonight at 2000 with nursing and daughter at bedside - expiration date and time was verified.   Sherron Monday, PharmD, BCCCP Clinical Pharmacist  Phone: [phone] 12/19/2019 10:29 PM  Please check AMION for all [facility] Pharmacy phone numbers After 10:00 PM, call Main Pharmacy [phone]

## 2019-12-19 NOTE — ED Notes (Signed)
5 staff at bedside to hold down pt while IV team places [place] line. Pt yelling, uncooperative.

## 2019-12-19 NOTE — Progress Notes (Signed)
ABG drawn, results given to MD. 

## 2019-12-19 NOTE — H&P (Signed)
NAME:  Elizabeth Shepherd, MRN:  [id number], DOB:  Apr 05, 1956, LOS: 0 ADMISSION DATE:  12/19/2019 REFERRING MD:  Renae Fickle Physician, CHIEF COMPLAINT: Altered mental status  Brief History   Patient was brought into the emergency room following being found knocking on people's doors at a hotel According to daughter, patient fell about 2 days ago, was slow to get up but was able to get up unable to move around Did not exhibit any weakness on any side, was slow but appropriate Patient was wandering around and knocking on people's doors was why she was brought into the hospital early this morning She had had a subdural bleed in the past about 3 years ago managed at [facility] Patient primarily resides in [place] Herself and her daughter were on a trip to go to [place] to see pts physician who was managing her pulmonary hypertension Treated for C. difficile in December 2020  On an infusion of Veletri  Past Medical History  History of subdural hemorrhage History of pulmonary hypertension Ataxia Chronic pain syndrome Chronic respiratory failure on home oxygen Reformed smoker Depression Hypercoagulable state-history of prothrombin mutation Atrial fibrillation Chronic metabolic acidosis  Significant Hospital Events   Required multiple doses of medications to sedate this morning as she was very agitated-ketamine, Geodon, benzodiazepines  Consults:  Neurosurgery Neurology  Procedures:  None   Significant Diagnostic Tests:  CT Head  IMPRESSION: 1. Moderate acute subdural hematoma over the left convexity most prominent over the temporal region with there is focal bulbous prominence of this hematoma measuring 2 cm in thickness. This subdural hematoma appears to communicate with an area of acute parenchymal hemorrhage over the left temporal lobe measuring 2.2 x 3.1 cm. There is minimal associated acute subarachnoid hemorrhage over the left temporal region. Small amount of acute  subdural hemorrhage over the anterior interhemispheric fissure as well as left tentorium. Local mass effect over the left convexity and mild midline shift to the right of 5-6 mm.  2. 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 Postsurgical defect over the left frontal parietal skull.  3.  No acute facial bone fracture.  4.  No acute cervical spine injury.  5. Enlarged heterogeneous thyroid likely goiter. Recommend thyroid ultrasound (ref: J Am Coll Radiol. 2015 Feb;12(2): 143-50).  Micro Data:   SARS coronavirus negative Influenza A and B negative  Antimicrobials:  None  Interim history/subjective:  Resting comfortably on a gurney Moving all extremities Attempting to converse with daughter  Objective   Blood pressure 98/71, pulse 74, temperature 98.4 F (36.9 C), temperature source Rectal, resp. rate 18, height 5\' 8"  (1.727 m), weight 77.1 kg, SpO2 99 %.        Intake/Output Summary (Last 24 hours) at 12/19/2019 1153 Last data filed at 12/19/2019 1046 Gross per 24 hour  Intake 1100.89 ml  Output --  Net 1100.89 ml   Filed Weights   12/19/19 0422  Weight: 77.1 kg    Examination: General: Elderly lady, chronically ill-appearing, facial bruising HENT: Dry oral mucosa Lungs: Fair air entry bilaterally, left chest wall port Cardiovascular: S1-S2 appreciated Abdomen: Bowel sounds appreciated Extremities: No clubbing, no edema Neuro: Arousable, able to be redirected, moving all extremities GU:   Resolved Hospital Problem list     Assessment & Plan:  Acute subdural hematoma Possibly acute on chronic subdural hematoma -Seen by neurosurgery -To be seen by neurology -We will admit to neuro ICU for monitoring -BP controlled -Not  tachycardic -Agitation is better  Coagulopathy -Received Kcentra  -Received vitamin K  -Coumadin on hold  Chronic respiratory failure -On 2 to 3 L of  oxygen -Saturating 90% on room air at present -Monitor closely -Oxygen supplementation as needed  Chronic pulmonary heart disease Idiopathic pulmonary arterial hypertension -On Veletri, tadalafil, tracleer  On hypercoagulable state -Anticoagulation will need to be held at present secondary to acute bleed  Chronic kidney disease -Monitor closely -Avoid nephrotoxic's  History of depression -Monitor Ataxia Chronic pain syndrome -As needed pain medications  We will have pharmacy help confirm current medications  With neurology -Agree with starting hypertonic saline  Risk with surgery and pts  severe pulmonary hypertension-recovery and rehabilitation will be very challenging  Best practice:  Diet: NPO but for meds Pain/Anxiety/Delirium protocol (if indicated): Precedex, Versed VAP protocol (if indicated): Not applicable DVT prophylaxis: SCD GI prophylaxis: PPI Glucose control: Will monitor Mobility: Bedrest Code Status: Full code Family Communication: Discussed with daughter daughter at bedside Disposition: Neuro ICU  Labs   CBC: Recent Labs  Lab 12/19/19 0625 12/19/19 0635  WBC 5.0  --   NEUTROABS 3.3  --   HGB 12.6 12.2  HCT 40.8 36.0  MCV 77.3*  --   PLT 223  --     Basic Metabolic Panel: Recent Labs  Lab 12/19/19 0625 12/19/19 0635  NA 134* 137  K 3.7 3.7  CL 109  --   CO2 11*  --   GLUCOSE 115*  --   BUN 24*  --   CREATININE 1.56*  --   CALCIUM 10.5*  --    GFR: Estimated Creatinine Clearance: 41.4 mL/min (A) (by C-G formula based on SCr of 1.56 mg/dL (H)). Recent Labs  Lab 12/19/19 0625  WBC 5.0  LATICACIDVEN 1.0    Liver Function Tests: Recent Labs  Lab 12/19/19 0625  AST 15  ALT 9  ALKPHOS 91  BILITOT 0.3  PROT 8.5*  ALBUMIN 4.0   No results for input(s): LIPASE, AMYLASE in the last 168 hours. Recent Labs  Lab 12/19/19 0625  AMMONIA 39*    ABG    Component Value Date/Time   PHART 7.348 (L) 12/19/2019 0635   PCO2ART  20.6 (L) 12/19/2019 0635   PO2ART 120.0 (H) 12/19/2019 0635   HCO3 11.3 (L) 12/19/2019 0635   TCO2 12 (L) 12/19/2019 0635   ACIDBASEDEF 12.0 (H) 12/19/2019 0635   O2SAT 99.0 12/19/2019 0635     Coagulation Profile: Recent Labs  Lab 12/19/19 0625  INR 5.6*    Cardiac Enzymes: Recent Labs  Lab 12/19/19 0625  CKTOTAL 181    HbA1C: No results found for: HGBA1C  CBG: Recent Labs  Lab 12/19/19 0436  GLUCAP 104*    Review of Systems:   Unable to obtain from patient  Past Medical History  She,  has no past medical history on file.   Surgical History   History reviewed. No pertinent surgical history.   Social History   reports that she has never smoked. She has never used smokeless tobacco. She reports current alcohol use. She reports current drug use.   Family History   Her family history is not on file.   Allergies Not on File   Home Medications  Prior to Admission medications   Not on File     The patient is critically ill with multiple organ systems failure and requires high complexity decision making for assessment and support, frequent evaluation and titration of therapies, application of advanced monitoring  technologies and extensive interpretation of multiple databases. Critical Care Time devoted to patient care services described in this note independent of APP/resident time (if applicable)  is 40 minutes.   Virl Diamond MD [facility] Personal pager: [phone] If unanswered, please page CCM On-call: #[phone]

## 2019-12-19 NOTE — Consult Note (Addendum)
NEURO HOSPITALIST CONSULT NOTE   Requesting physician: Dr. Wynona Neat   Reason for Consult: subdural   History obtained from:  Chart review HPI:                                                                                                                                          Elizabeth Shepherd is an 64 y.o. 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  Patient was found at a hotel with AMS. She was knocking on doors. According to her daughter patient fell about 2 days ago. She was slow to get up but was able to get up. Treated for C.diff and MSSA in December 2020 for which she is on long term antibiotics ( she has/ PICC for OP infusions of Vancomycin)  Intracerebral Hemorrhage (ICH) Score Total:  0  ED course:  CTH: 1. Moderate acute subdural hematoma over the left convexity most prominent over the temporal region with there is focal bulbous prominence of this hematoma measuring 2 cm in thickness. This subdural hematoma appears to communicate with an area of acute parenchymal hemorrhage over the left temporal lobe measuring 2.2 x 3.1 cm. There is minimal associated acute subarachnoid hemorrhage over the left temporal region. Small amount of acute subdural hemorrhage over the anterior interhemispheric fissure as well as left tentorium. Local mass effect over the left convexity and mild midline shift to the right of 5-6 mm. UA: negative for UTI, UDS: + benzo's, TSH: WNL, ammonia: 39, ETOH: < 10 CBG: 104 PT: 51.0 INR: 5.6 BP: 106/77 Kcentra and vitamin K given to reverse coumadin Patient was given Haldol, Geodon, Ativan, Ketamin and started on a precedex drip ( now off) for her agitation upon arrival History reviewed. No pertinent past medical history.  History reviewed. No pertinent surgical history.  History reviewed. No pertinent family  history.             Social History:  reports that she has never smoked. She has never used smokeless tobacco. She reports current alcohol use. She reports current drug use.  Not on File  MEDICATIONS:  Current Facility-Administered Medications  Medication Dose Route Frequency Provider Last Rate Last Admin  . 0.9 %  sodium chloride infusion   Intravenous Continuous Olalere, Adewale A, MD      . dexmedetomidine (PRECEDEX) 400 MCG/100ML (4 mcg/mL) infusion  0.4-1.2 mcg/kg/hr Intravenous Titrated Ward, Kristen N, DO   Stopped at 12/19/19 1001  . docusate sodium (COLACE) capsule 100 mg  100 mg Oral BID PRN Olalere, Adewale A, MD      . LORazepam (ATIVAN) injection 1 mg  1 mg Intravenous Once Ward, Kristen N, DO   Stopped at 12/19/19 0700  . midazolam (VERSED) injection 1 mg  1 mg Intravenous Q2H PRN Olalere, Adewale A, MD      . naloxone (NARCAN) 2 MG/2ML injection           . polyethylene glycol (MIRALAX / GLYCOLAX) packet 17 g  17 g Oral Daily PRN Olalere, Adewale A, MD       Current Outpatient Medications  Medication Sig Dispense Refill  . bosentan (TRACLEER) 125 MG tablet Take 125 mg by mouth 2 (two) times daily.    . busPIRone (BUSPAR) 10 MG tablet Take 10 mg by mouth 2 (two) times daily.    . cholecalciferol (VITAMIN D3) 25 MCG (1000 UNIT) tablet Take 1,000 Units by mouth daily.    Marland Kitchen dicyclomine (BENTYL) 10 MG capsule Take 10 mg by mouth 4 (four) times daily -  before meals and at bedtime.    . diphenoxylate-atropine (LOMOTIL) 2.5-0.025 MG tablet Take 1 tablet by mouth 4 (four) times daily as needed for diarrhea or loose stools.    Marland Kitchen epoprostenol (VELETRI) 1.5 MG injection Inject into the vein. 4 vials water. Cassette. 1am.    . ferrous sulfate 325 (65 FE) MG tablet Take 325 mg by mouth daily with breakfast.    . furosemide (LASIX) 80 MG tablet Take 80 mg by mouth 2 (two)  times daily.    Marland Kitchen gabapentin (NEURONTIN) 800 MG tablet Take 800 mg by mouth 3 (three) times daily.    . hydrocortisone-pramoxine (PROCTOFOAM-HC) rectal foam Place 1 applicator rectally 2 (two) times daily.    Marland Kitchen lactobacillus acidophilus (BACID) TABS tablet Take 2 tablets by mouth 2 (two) times daily.    Marland Kitchen LORazepam (ATIVAN) 1 MG tablet Take 1 mg by mouth 2 (two) times daily as needed for anxiety.    . magnesium gluconate (MAGONATE) 500 MG tablet Take 500 mg by mouth 2 (two) times daily.    . Multiple Vitamins-Minerals (MULTIVITAMIN WITH MINERALS) tablet Take 1 tablet by mouth daily.    . OXYGEN Inhale 3 L into the lungs continuous.    . potassium chloride (KLOR-CON) 10 MEQ tablet Take 40 mEq by mouth 2 (two) times daily.    . sodium bicarbonate 650 MG tablet Take 650 mg by mouth 2 (two) times daily.    Marland Kitchen spironolactone (ALDACTONE) 100 MG tablet Take 100 mg by mouth 2 (two) times daily.    . temazepam (RESTORIL) 30 MG capsule Take 30 mg by mouth at bedtime.    Marland Kitchen warfarin (COUMADIN) 10 MG tablet Take 10 mg by mouth daily.        ROS:  unobtainable from patient due to mental status    Blood pressure 105/69, pulse 80, temperature 98.4 F (36.9 C), temperature source Rectal, resp. rate 20, height 5\' 8"  (1.727 m), weight 77.1 kg, SpO2 99 %.   General Examination:                                                                                                       Physical Exam  Constitutional: Appears well-developed and well-nourished.  Psych: Affect appropriate to situation Eyes: Normal external eye and conjunctiva. HENT: Normocephalic, no lesions, without obvious abnormality.   Musculoskeletal-no joint tenderness, deformity or swelling Cardiovascular: Normal rate and regular rhythm.  Respiratory: Effort normal, non-labored breathing saturations WNL on   GI: Soft.  No distension. There is no tenderness.  Skin: WDI, generalized bruising  Neurological Examination Mental Status: Drowsy oriented to name. Not aphasic,  Able to follow a few simple commands. ( squeeze my hand, open close eyes, stick your tongue out) Cranial Nerves: PERRL, squeezes eyes shut unable to test confrontation, no nystagmus noted, face appears asymmetric with a right facial droop, midline tongue extension  Motor/sensory: Able to  Lift all 4 anti- gravity with good strength. Some right hand weakness noted with right hand grip. Tone and bulk:normal tone throughout; no atrophy noted Localizes and withdraws to noxious in all 4 Deep Tendon Reflexes: 2+ and symmetric biceps and patella Plantars: Right: downgoing   Left: downgoing Cerebellar: UTA    Lab Results: Basic Metabolic Panel: Recent Labs  Lab 12/19/19 0625 12/19/19 0635  NA 134* 137  K 3.7 3.7  CL 109  --   CO2 11*  --   GLUCOSE 115*  --   BUN 24*  --   CREATININE 1.56*  --   CALCIUM 10.5*  --     CBC: Recent Labs  Lab 12/19/19 0625 12/19/19 0635  WBC 5.0  --   NEUTROABS 3.3  --   HGB 12.6 12.2  HCT 40.8 36.0  MCV 77.3*  --   PLT 223  --     Cardiac Enzymes: Recent Labs  Lab 12/19/19 0625  CKTOTAL 181    Imaging: CT Head Wo Contrast  Result Date: 12/19/2019 CLINICAL DATA:  Encephalopathy.  Trauma. EXAM: CT HEAD WITHOUT CONTRAST CT MAXILLOFACIAL WITHOUT CONTRAST CT CERVICAL SPINE WITHOUT CONTRAST TECHNIQUE: Multidetector CT imaging of the head, cervical spine, and maxillofacial structures were performed using the standard protocol without intravenous contrast. Multiplanar CT image reconstructions of the cervical spine and maxillofacial structures were also generated. COMPARISON:  None. FINDINGS: CT HEAD FINDINGS Brain: Examination demonstrates an acute left subdural hematoma most prominent over the temporal region rib there is a focal bulbous area measuring approximately 2 cm in  thickness. This focal masslike area of acute subdural hemorrhage appears to communicate with a parenchymal component of acute hemorrhage over the posterior left temporal region measuring 2.2 x 3.1 cm. There is a component of acute subdural hemorrhage over the left side of the anterior interhemispheric fissure. This also a component of acute subarachnoid hemorrhage over the left tentorium. There is mild  local mass effect over the left hemisphere with midline shift to the right of approximately 5-6 mm. Possible mild associated white matter edema over the left parietal/posterior temporal region. Subtle focal areas of acute subarachnoid hemorrhage over the left temporal region. Small lower density subdural fluid collection along the high left falx measuring 6-7 mm in thickness. Vascular: No hyperdense vessel or unexpected calcification. Skull: Normal. Negative for fracture or focal lesion. Oval focal postsurgical defect over the left frontal parietal skull. Other: None. CT MAXILLOFACIAL FINDINGS Osseous: No evidence of facial bone fracture. Orbits: Negative. No traumatic or inflammatory finding. Sinuses: Paranasal sinuses are well developed and well aerated without air-fluid level. Subtle focal opacification over the right ethmoid air cells and small mucous retention cyst over the anterior floor the left maxillary sinus. Ostiomeatal complexes are patent. Visualized mastoid air cells are clear. Soft tissues: No focal soft tissue injury. CT CERVICAL SPINE FINDINGS Alignment: Normal. Skull base and vertebrae: Vertebral body heights are normal. Minimal spondylosis throughout the cervical spine. Atlantoaxial articulation is unremarkable. No evidence of acute fracture or traumatic subluxation. Soft tissues and spinal canal: No prevertebral fluid or swelling. No visible canal hematoma. Disc levels:  No significant disc space narrowing. Upper chest: Enlarged heterogeneous goiter. Other: None. IMPRESSION: 1. Moderate acute  subdural hematoma over the left convexity most prominent over the temporal region with there is focal bulbous prominence of this hematoma measuring 2 cm in thickness. This subdural hematoma appears to communicate with an area of acute parenchymal hemorrhage over the left temporal lobe measuring 2.2 x 3.1 cm. There is minimal associated acute subarachnoid hemorrhage over the left temporal region. Small amount of acute subdural hemorrhage over the anterior interhemispheric fissure as well as left tentorium. Local mass effect over the left convexity and mild midline shift to the right of 5-6 mm. 2. 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 Postsurgical defect over the left frontal parietal skull. 3.  No acute facial bone fracture. 4.  No acute cervical spine injury. 5. Enlarged heterogeneous thyroid likely goiter. Recommend thyroid ultrasound (ref: J Am Coll Radiol. 2015 Feb;12(2): 143-50). Critical Value/emergent results were called by telephone at the time of interpretation on 12/19/2019 at 11:08 am to provider Dr. Barbie Banner, who verbally acknowledged these results. Electronically Signed   By: Elberta Fortis M.D.   On: 12/19/2019 11:09   CT Cervical Spine Wo Contrast  Result Date: 12/19/2019 CLINICAL DATA:  Encephalopathy.  Trauma. EXAM: CT HEAD WITHOUT CONTRAST CT MAXILLOFACIAL WITHOUT CONTRAST CT CERVICAL SPINE WITHOUT CONTRAST TECHNIQUE: Multidetector CT imaging of the head, cervical spine, and maxillofacial structures were performed using the standard protocol without intravenous contrast. Multiplanar CT image reconstructions of the cervical spine and maxillofacial structures were also generated. COMPARISON:  None. FINDINGS: CT HEAD FINDINGS Brain: Examination demonstrates an acute left subdural hematoma most prominent over the temporal region rib there is a focal bulbous area measuring approximately 2 cm in thickness. This focal  masslike area of acute subdural hemorrhage appears to communicate with a parenchymal component of acute hemorrhage over the posterior left temporal region measuring 2.2 x 3.1 cm. There is a component of acute subdural hemorrhage over the left side of the anterior interhemispheric fissure. This also a component of acute subarachnoid hemorrhage over the left tentorium. There is mild local mass effect over the left hemisphere with midline shift to the right of approximately 5-6 mm. Possible mild associated white matter edema over the left parietal/posterior temporal region.  Subtle focal areas of acute subarachnoid hemorrhage over the left temporal region. Small lower density subdural fluid collection along the high left falx measuring 6-7 mm in thickness. Vascular: No hyperdense vessel or unexpected calcification. Skull: Normal. Negative for fracture or focal lesion. Oval focal postsurgical defect over the left frontal parietal skull. Other: None. CT MAXILLOFACIAL FINDINGS Osseous: No evidence of facial bone fracture. Orbits: Negative. No traumatic or inflammatory finding. Sinuses: Paranasal sinuses are well developed and well aerated without air-fluid level. Subtle focal opacification over the right ethmoid air cells and small mucous retention cyst over the anterior floor the left maxillary sinus. Ostiomeatal complexes are patent. Visualized mastoid air cells are clear. Soft tissues: No focal soft tissue injury. CT CERVICAL SPINE FINDINGS Alignment: Normal. Skull base and vertebrae: Vertebral body heights are normal. Minimal spondylosis throughout the cervical spine. Atlantoaxial articulation is unremarkable. No evidence of acute fracture or traumatic subluxation. Soft tissues and spinal canal: No prevertebral fluid or swelling. No visible canal hematoma. Disc levels:  No significant disc space narrowing. Upper chest: Enlarged heterogeneous goiter. Other: None. IMPRESSION: 1. Moderate acute subdural hematoma over the  left convexity most prominent over the temporal region with there is focal bulbous prominence of this hematoma measuring 2 cm in thickness. This subdural hematoma appears to communicate with an area of acute parenchymal hemorrhage over the left temporal lobe measuring 2.2 x 3.1 cm. There is minimal associated acute subarachnoid hemorrhage over the left temporal region. Small amount of acute subdural hemorrhage over the anterior interhemispheric fissure as well as left tentorium. Local mass effect over the left convexity and mild midline shift to the right of 5-6 mm. 2. 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 Postsurgical defect over the left frontal parietal skull. 3.  No acute facial bone fracture. 4.  No acute cervical spine injury. 5. Enlarged heterogeneous thyroid likely goiter. Recommend thyroid ultrasound (ref: J Am Coll Radiol. 2015 Feb;12(2): 143-50). Critical Value/emergent results were called by telephone at the time of interpretation on 12/19/2019 at 11:08 am to provider Dr. Barbie Banner, who verbally acknowledged these results. Electronically Signed   By: Elberta Fortis M.D.   On: 12/19/2019 11:09   DG Chest Portable 1 View  Result Date: 12/19/2019 CLINICAL DATA:  Initial evaluation for acute altered mental status. EXAM: PORTABLE CHEST 1 VIEW COMPARISON:  Prior radiograph from 08/02/2008. FINDINGS: Patient rotated to the left. Left-sided Port-A-Cath in place with tip overlying the cavoatrial junction. Mild cardiomegaly. Mediastinal silhouette normal. Aortic atherosclerosis. Lungs hypoinflated. 19 mm nodular density seen overlying the right infrahilar region, suspected reflect a portion of the right pulmonary arteries, although underlying nodule not excluded. No focal infiltrates. No edema or effusion. No pneumothorax. No acute osseous finding. IMPRESSION: 1. 19 mm nodular density overlying the right infrahilar region,  suspected to be vascular in nature related to the underlying right pulmonary arteries. A possible underlying nodule/mass could also have this appearance. Further assessment with dedicated cross-sectional imaging of the chest suggested for further evaluation. 2. Mild cardiomegaly. No other active cardiopulmonary disease. Electronically Signed   By: Rise Mu M.D.   On: 12/19/2019 06:25   DG Shoulder Left Portable  Result Date: 12/19/2019 CLINICAL DATA:  Bruising to shoulder EXAM: LEFT SHOULDER COMPARISON:  None. FINDINGS: Alignment is anatomic on this single view. There is no acute fracture. Joint space is preserved. Chest findings dictated separately. IMPRESSION: Negative. Electronically Signed   By: Guadlupe Spanish M.D.   On:  12/19/2019 07:06   DG Shoulder Right Portable  Result Date: 12/19/2019 CLINICAL DATA:  Shoulder bruising EXAM: PORTABLE RIGHT SHOULDER COMPARISON:  None. FINDINGS: There is alignment at the right glenohumeral joint on this single view. No acute fracture. Deformity and remodeling of the mid right humerus secondary to prior fracture. Joint space appears preserved. Chest findings dictated separately. IMPRESSION: No acute osseous abnormality. Electronically Signed   By: Macy Mis M.D.   On: 12/19/2019 07:07   CT Maxillofacial Wo Contrast  Result Date: 12/19/2019 CLINICAL DATA:  Encephalopathy.  Trauma. EXAM: CT HEAD WITHOUT CONTRAST CT MAXILLOFACIAL WITHOUT CONTRAST CT CERVICAL SPINE WITHOUT CONTRAST TECHNIQUE: Multidetector CT imaging of the head, cervical spine, and maxillofacial structures were performed using the standard protocol without intravenous contrast. Multiplanar CT image reconstructions of the cervical spine and maxillofacial structures were also generated. COMPARISON:  None. FINDINGS: CT HEAD FINDINGS Brain: Examination demonstrates an acute left subdural hematoma most prominent over the temporal region rib there is a focal bulbous area measuring  approximately 2 cm in thickness. This focal masslike area of acute subdural hemorrhage appears to communicate with a parenchymal component of acute hemorrhage over the posterior left temporal region measuring 2.2 x 3.1 cm. There is a component of acute subdural hemorrhage over the left side of the anterior interhemispheric fissure. This also a component of acute subarachnoid hemorrhage over the left tentorium. There is mild local mass effect over the left hemisphere with midline shift to the right of approximately 5-6 mm. Possible mild associated white matter edema over the left parietal/posterior temporal region. Subtle focal areas of acute subarachnoid hemorrhage over the left temporal region. Small lower density subdural fluid collection along the high left falx measuring 6-7 mm in thickness. Vascular: No hyperdense vessel or unexpected calcification. Skull: Normal. Negative for fracture or focal lesion. Oval focal postsurgical defect over the left frontal parietal skull. Other: None. CT MAXILLOFACIAL FINDINGS Osseous: No evidence of facial bone fracture. Orbits: Negative. No traumatic or inflammatory finding. Sinuses: Paranasal sinuses are well developed and well aerated without air-fluid level. Subtle focal opacification over the right ethmoid air cells and small mucous retention cyst over the anterior floor the left maxillary sinus. Ostiomeatal complexes are patent. Visualized mastoid air cells are clear. Soft tissues: No focal soft tissue injury. CT CERVICAL SPINE FINDINGS Alignment: Normal. Skull base and vertebrae: Vertebral body heights are normal. Minimal spondylosis throughout the cervical spine. Atlantoaxial articulation is unremarkable. No evidence of acute fracture or traumatic subluxation. Soft tissues and spinal canal: No prevertebral fluid or swelling. No visible canal hematoma. Disc levels:  No significant disc space narrowing. Upper chest: Enlarged heterogeneous goiter. Other: None. IMPRESSION:  1. Moderate acute subdural hematoma over the left convexity most prominent over the temporal region with there is focal bulbous prominence of this hematoma measuring 2 cm in thickness. This subdural hematoma appears to communicate with an area of acute parenchymal hemorrhage over the left temporal lobe measuring 2.2 x 3.1 cm. There is minimal associated acute subarachnoid hemorrhage over the left temporal region. Small amount of acute subdural hemorrhage over the anterior interhemispheric fissure as well as left tentorium. Local mass effect over the left convexity and mild midline shift to the right of 5-6 mm. 2. 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 Postsurgical defect over the left frontal parietal skull. 3.  No acute facial bone fracture. 4.  No acute cervical spine injury. 5. Enlarged heterogeneous thyroid likely goiter. Recommend thyroid  ultrasound (ref: J Am Coll Radiol. 2015 Feb;12(2): 143-50). Critical Value/emergent results were called by telephone at the time of interpretation on 12/19/2019 at 11:08 am to provider Dr. Barbie Banner, who verbally acknowledged these results. Electronically Signed   By: Elberta Fortis M.D.   On: 12/19/2019 11:09    Assessment: 64 y.o. 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 Neurosurgery was consulted and no neurosurgical intervention at this time. Considering hypertonic saline, will discuss with CCM d/t pulmonary HTN. Likely from trauma vs therapeutic INR.   Left temporal ICH Left Subdural Hemorrhage Cytotoxic and Vasogenic Cerebral Acute Encephalopathy Supratherapeutic INR Pulmonary HTN   Recommendations: -Frequent neurochecks: Stat neurosurgery consult if patient declines -SBP goal < 140; labetalol and or cleviprex to keep SBP @ goal -repeat CTH after  kcentra -re-check INR ( make sure it is WNL) -- hold anticoagulation for now -PT/OT/ speech -SCD's -telemetry   Valentina Lucks, MSN, NP-C [facility] [phone]  Attending neurologist's note to follow   12/19/2019, 12:03 PM    NEUROHOSPITALIST ADDENDUM Performed a face to face diagnostic evaluation.   I have reviewed the contents of history and physical exam as documented by PA/ARNP/Resident and agree with above documentation.  I have discussed and formulated the above plan as documented. Edits to the note have been made as needed.  64 year old female with severe pulmonary hypertension presents to the emergency department altered mental status-CT head shows left-sided subdural hemorrhage with left temporal hemorrhage, also small volume of subarachnoid blood and subdural over the interhemispheric fissure. 5 to 6 mm midline shift.  INR 5.7.  Received Kcentra immediately once CT head showed hemorrhage.  Neurosurgery initially consulted, neurology also consulted by ED.  Blood pressure has remained below 140 systolic.  On examination patient is arousable after sternal rub, answers her name, age and follows simple commands.  Appears to be mildly aphasic, due to lethargy difficult to interpret degree of aphasia. She has 3/5 right upper extremity weakness, moves her left arm as well as both legs with good strength   Repeated CT head, mostly stable (official report states 34mm). - INR is 1.4   Started hypertonic saline,  discussed with Dr. Maisie Fus from neurosurgery-felt she is not a surgical candidate right now.  As patient has pulmonary hypertension ,also discussed with CCM attending- felt reasonably safe to start hypertonic saline .    This patient is neurologically critically ill due to  ICH with SDH.  She is at risk for significant risk of neurological worsening from cerebral edema,  death from brain herniation, heart failure, infection, respiratory failure and seizure.  This patient's care requires constant monitoring of vital signs, hemodynamics, respiratory and cardiac monitoring, review of multiple databases, neurological assessment, discussion with family, other specialists and medical decision making of high complexity.  I spent 45 minutes of neurocritical time in the care of this patient.    Georgiana Spinner Elizabeth Twitty MD [facility] [phone]   If 7pm to 7am, please call on call as listed on AMION.

## 2019-12-19 NOTE — Progress Notes (Addendum)
Unable to obtain screening questionnaire- pt confused

## 2019-12-19 NOTE — ED Notes (Signed)
Pt transported to CT with 2 RNs

## 2019-12-19 NOTE — ED Notes (Signed)
Report given to oncoming nurse, care transferred at this time

## 2019-12-20 DIAGNOSIS — R451 Restlessness and agitation: Secondary | ICD-10-CM

## 2019-12-20 LAB — BASIC METABOLIC PANEL
Anion gap: 7 (ref 5–15)
BUN: 18 mg/dL (ref 8–23)
CO2: 14 mmol/L — ABNORMAL LOW (ref 22–32)
Calcium: 9.3 mg/dL (ref 8.9–10.3)
Chloride: 126 mmol/L — ABNORMAL HIGH (ref 98–111)
Creatinine, Ser: 0.93 mg/dL (ref 0.44–1.00)
GFR calc Af Amer: >60 mL/min (ref >60)
GFR calc non Af Amer: >60 mL/min (ref >60)
Glucose, Bld: 178 mg/dL — ABNORMAL HIGH (ref 70–99)
Potassium: 3.3 mmol/L — ABNORMAL LOW (ref 3.5–5.1)
Sodium: 147 mmol/L — ABNORMAL HIGH (ref 135–145)

## 2019-12-20 LAB — CBC
HCT: 28.6 % — ABNORMAL LOW (ref 36.0–46.0)
Hemoglobin: 9.1 g/dL — ABNORMAL LOW (ref 12.0–15.0)
MCH: 24.5 pg — ABNORMAL LOW (ref 26.0–34.0)
MCHC: 31.8 g/dL (ref 30.0–36.0)
MCV: 76.9 fL — ABNORMAL LOW (ref 80.0–100.0)
Platelets: 183 10*3/uL (ref 150–400)
RBC: 3.72 MIL/uL — ABNORMAL LOW (ref 3.87–5.11)
RDW: 18.2 % — ABNORMAL HIGH (ref 11.5–15.5)
WBC: 3.8 10*3/uL — ABNORMAL LOW (ref 4.0–10.5)
nRBC: 0 % (ref 0.0–0.2)

## 2019-12-20 LAB — PROTIME-INR
INR: 1.4 — ABNORMAL HIGH (ref 0.8–1.2)
Prothrombin Time: 16.8 seconds — ABNORMAL HIGH (ref 11.4–15.2)

## 2019-12-20 LAB — HIV ANTIBODY (ROUTINE TESTING W REFLEX): HIV Screen 4th Generation wRfx: NONREACTIVE

## 2019-12-20 LAB — VOLATILES,BLD-ACETONE,ETHANOL,ISOPROP,METHANOL
Acetone, blood: <0.01 g/dL (ref 0.000–0.010)
Ethanol, blood: <0.01 g/dL (ref 0.000–0.010)
Isopropanol, blood: <0.01 g/dL (ref 0.000–0.010)
Methanol, blood: <0.01 g/dL (ref 0.000–0.010)

## 2019-12-20 LAB — SODIUM: Sodium: 150 mmol/L — ABNORMAL HIGH (ref 135–145)

## 2019-12-20 MED ORDER — PH 12 STERILE DILUENT: 45.5000 ng/kg/min | INTRAVENOUS | Status: DC

## 2019-12-20 MED ORDER — GABAPENTIN 400 MG PO CAPS
400.0000 mg | ORAL_CAPSULE | Freq: Three times a day (TID) | ORAL | Status: DC
  Administered 2019-12-21 – 2019-12-26 (×15): 400 mg via ORAL
  Filled 2019-12-20 (×16): qty 1

## 2019-12-20 MED ORDER — STERILE DILUENT/EPOPROSTENOL IV SOLN
INTRAVENOUS | Status: DC
  Filled 2019-12-20 (×5): qty 2

## 2019-12-20 MED ORDER — LEVETIRACETAM IN NACL 500 MG/100ML IV SOLN
500.0000 mg | Freq: Two times a day (BID) | INTRAVENOUS | Status: DC
  Administered 2019-12-20 – 2019-12-25 (×12): 500 mg via INTRAVENOUS
  Filled 2019-12-20 (×12): qty 100

## 2019-12-20 MED ORDER — VALPROIC ACID 250 MG/5ML PO SOLN
500.0000 mg | Freq: Three times a day (TID) | ORAL | Status: DC
  Administered 2019-12-20 – 2019-12-26 (×18): 500 mg via ORAL
  Filled 2019-12-20 (×19): qty 10

## 2019-12-20 MED ORDER — ICE PACK FOR EPOPROSTENOL (FLOLAN) INFUSION
1.0000 | Freq: Four times a day (QID) | Status: DC
  Administered 2019-12-20: 1
  Filled 2019-12-20 (×2): qty 1

## 2019-12-20 NOTE — Evaluation (Signed)
Clinical/Bedside Swallow Evaluation Patient Details  Name: Elizabeth Shepherd MRN: [id number] Date of Birth: 08-19-1956  Today's Date: 12/20/2019 Time: SLP Start Time (ACUTE ONLY): 1622 SLP Stop Time (ACUTE ONLY): 1632 SLP Time Calculation (min) (ACUTE ONLY): 10 min  Past Medical History: History reviewed. No pertinent past medical history. Past Surgical History: History reviewed. No pertinent surgical history. HPI:  64 yr old from [place] on her way to [place] with family admitted after being found knocking on people's doors at a hotel. Chart reveals that patient fell about 2 days ago, was slow to get up but was able to get up unable to move around. CT moderate acute subdural hematoma over the left convexity most prominent over the temporal region with there is focal bulbous prominence of this hematoma measuring 2 cm in thickness. SDH appears to communicate with an area of acute parenchymal hemorrhage over the left temporal lobe measuring 2.2 x 3.1 cm. Minimal associated acute subarachnoid hemorrhage over the left temporal region. Small amount of acute subdural hemorrhage over the anterior interhemispheric fissure as well as   Assessment / Plan / Recommendation Clinical Impression  Pt exhibits a cognitive based dysphagia marked by waxing and waning level of alertness from agitated (per RN) to lethargic. At time of assessment she was calm, sleepy and needed stimulation that did not adequately increase LOC. Lethargy limited ability to follow commands. Verbal expression consisted of language of confusion and noted to be dysarthric. RN stated she had not noted s/s aspiration today. Graham cracker placed in anterior oral cavity without attempts to manipulate therefore therapist removed. Sips water via straw were unremarkable for dysfunction although intake brief at this time. Quanity and quality of po intake should improve as pt becomes more alert. Downgrading texture to Dys 2, continue thin, crush meds and full  assistance. ST will follow for safety and advancement when able.     SLP Visit Diagnosis: Dysphagia, unspecified (R13.10)    Aspiration Risk  Mild aspiration risk    Diet Recommendation Thin liquid;Dysphagia 2 (Fine chop)   Liquid Administration via: Cup;Straw Medication Administration: Whole meds with puree Supervision: Full supervision/cueing for compensatory strategies;Staff to assist with self feeding Compensations: Minimize environmental distractions;Slow rate;Small sips/bites Postural Changes: Seated upright at 90 degrees    Other  Recommendations Oral Care Recommendations: Oral care BID   Follow up Recommendations 24 hour supervision/assistance      Frequency and Duration min 2x/week  2 weeks       Prognosis Prognosis for Safe Diet Advancement: (fair-good) Barriers to Reach Goals: Cognitive deficits      Swallow Study   General HPI: 64 yr old from [place] on her way to [place] with family admitted after being found knocking on people's doors at a hotel. Chart reveals that patient fell about 2 days ago, was slow to get up but was able to get up unable to move around. CT moderate acute subdural hematoma over the left convexity most prominent over the temporal region with there is focal bulbous prominence of this hematoma measuring 2 cm in thickness. SDH appears to communicate with an area of acute parenchymal hemorrhage over the left temporal lobe measuring 2.2 x 3.1 cm. Minimal associated acute subarachnoid hemorrhage over the left temporal region. Small amount of acute subdural hemorrhage over the anterior interhemispheric fissure as well as Type of Study: Bedside Swallow Evaluation Previous Swallow Assessment: (none) Diet Prior to this Study: Regular;Thin liquids Temperature Spikes Noted: No Respiratory Status: Nasal cannula History of Recent Intubation: No  Behavior/Cognition: Lethargic/Drowsy;Requires cueing;Confused Oral Cavity Assessment: Dry Oral Care Completed by SLP:  No Oral Cavity - Dentition: Adequate natural dentition Vision: (?) Self-Feeding Abilities: Needs assist Patient Positioning: Upright in bed Baseline Vocal Quality: Low vocal intensity Volitional Cough: Cognitively unable to elicit Volitional Swallow: Unable to elicit    Oral/Motor/Sensory Function Overall Oral Motor/Sensory Function: Generalized oral weakness(would not follow commands)   Ice Chips Ice chips: Not tested   Thin Liquid Thin Liquid: Impaired Oral Phase Impairments: Reduced labial seal;Reduced lingual movement/coordination Oral Phase Functional Implications: Right anterior spillage;Left anterior spillage Pharyngeal  Phase Impairments: (none)    Nectar Thick Nectar Thick Liquid: Not tested   Honey Thick Honey Thick Liquid: Not tested   Puree Puree: Not tested   Solid     Solid: (no attempts to manipulate)      Houston Siren 12/20/2019,4:54 PM  Orbie Pyo Colvin Caroli.Ed Risk analyst [phone] Office [phone]

## 2019-12-20 NOTE — Progress Notes (Signed)
Bosentan and Veletri storing in main pharmacy - patient supplied medication. Tadalafil being supplied by [facility].   Cassette changed tonight at 2000 with nursing, rapid response, and daughter at bedside - expiration date and time was verified prior to administration.  Sherron Monday, PharmD, BCCCP Clinical Pharmacist  Phone: [phone] 12/20/2019 8:32 PM  Please check AMION for all [facility] phone numbers After 10:00 PM, call Main Pharmacy [phone]

## 2019-12-20 NOTE — Progress Notes (Signed)
Subjective: Patient has had episodes of agitation, but her mental status is significantly improved from time of evaluation in ER.  Objective: Vital signs in last 24 hours: Temp:  [97 F (36.1 C)-98 F (36.7 C)] 97.4 F (36.3 C) (04/11 0800) Pulse Rate:  [55-95] 60 (04/11 1000) Resp:  [13-28] 18 (04/11 1000) BP: (86-132)/(58-86) 93/58 (04/11 1000) SpO2:  [93 %-100 %] 98 % (04/11 1000)  Intake/Output from previous day: 04/10 0701 - 04/11 0700 In: 2439.7 [I.V.:1370.4; IV Piggyback:1069.3] Out: 550 [Urine:550] Intake/Output this shift: Total I/O In: 584.9 [P.O.:240; I.V.:344.9] Out: 550 [Urine:550]  Awake, alert.  Eyes open spont.PERRL. Oriented to person. She was not cooperating well with strength exam but follows commands, greater than antigravity in RUE,LUE and bilateral LEs. There is some receptive dysphasia, milder than yesterday.  Lab Results: Recent Labs    12/19/19 0625 12/19/19 0625 12/19/19 0635 12/20/19 0528  WBC 5.0  --   --  3.8*  HGB 12.6   < > 12.2 9.1*  HCT 40.8   < > 36.0 28.6*  PLT 223  --   --  183   < > = values in this interval not displayed.   BMET Recent Labs    12/19/19 0625 12/19/19 0625 12/19/19 0635 12/20/19 0528  NA 134*   < > 137 147*  K 3.7   < > 3.7 3.3*  CL 109  --   --  126*  CO2 11*  --   --  14*  GLUCOSE 115*  --   --  178*  BUN 24*  --   --  18  CREATININE 1.56*  --   --  0.93  CALCIUM 10.5*  --   --  9.3   < > = values in this interval not displayed.    Studies/Results: CT HEAD WO CONTRAST  Result Date: 12/19/2019 CLINICAL DATA:  64 year old female status post trauma with intracranial hemorrhage. EXAM: CT HEAD WITHOUT CONTRAST TECHNIQUE: Contiguous axial images were obtained from the base of the skull through the vertex without intravenous contrast. COMPARISON:  Noncontrast head CT 0951 hours today. FINDINGS: Brain: Wide ranging hyperdense left side subdural hematoma is generally 5-7 mm thickness along the left hemisphere,  although focal lobulations of blood along the left temporal lobe and operculum are up to 20 mm and stable. Small volume left para falcine subdural blood with both hyperdense and hypodense components appears stable. Superimposed posterior left temporal lobe hemorrhagic contusion with surrounding edema is stable (series 3, image 15). There could be additional smaller hemorrhagic contusion in the left anterior temporal lobe. Rightward midline shift is stable at 7 mm. Stable effaced left lateral ventricle. No ventricular trapping. Stable blood along the left tentorium. Basilar cisterns are patent and stable. No new or progressive intracranial hemorrhage identified. No acute cortically based infarct. Vascular: Mild Calcified atherosclerosis at the skull base. Skull: Chronic left superior frontal convexity burr hole. No acute osseous abnormality identified. Sinuses/Orbits: Mild left mastoid effusion appears stable and the left tympanic cavity remains clear. Smaller right inferior mastoid effusion, also stable with clear right tympanic cavity. Mild paranasal sinus mucosal thickening. Other: No acute orbit or scalp soft tissue findings. IMPRESSION: 1. Stable extensive left side subdural hematoma, lobulated and ranging from 7 mm to 20 mm in thickness. 2. Stable left temporal lobe hemorrhagic contusion. Stable small volume left para falcine subdural blood. 3. Stable intracranial mass effect with rightward midline shift at 7 mm. 4. No new intracranial abnormality. 5. Mild bilateral mastoid effusions.  No acute skull fracture identified. Electronically Signed   By: Odessa Fleming M.D.   On: 12/19/2019 15:29   CT Head Wo Contrast  Result Date: 12/19/2019 CLINICAL DATA:  Encephalopathy.  Trauma. EXAM: CT HEAD WITHOUT CONTRAST CT MAXILLOFACIAL WITHOUT CONTRAST CT CERVICAL SPINE WITHOUT CONTRAST TECHNIQUE: Multidetector CT imaging of the head, cervical spine, and maxillofacial structures were performed using the standard protocol  without intravenous contrast. Multiplanar CT image reconstructions of the cervical spine and maxillofacial structures were also generated. COMPARISON:  None. FINDINGS: CT HEAD FINDINGS Brain: Examination demonstrates an acute left subdural hematoma most prominent over the temporal region rib there is a focal bulbous area measuring approximately 2 cm in thickness. This focal masslike area of acute subdural hemorrhage appears to communicate with a parenchymal component of acute hemorrhage over the posterior left temporal region measuring 2.2 x 3.1 cm. There is a component of acute subdural hemorrhage over the left side of the anterior interhemispheric fissure. This also a component of acute subarachnoid hemorrhage over the left tentorium. There is mild local mass effect over the left hemisphere with midline shift to the right of approximately 5-6 mm. Possible mild associated white matter edema over the left parietal/posterior temporal region. Subtle focal areas of acute subarachnoid hemorrhage over the left temporal region. Small lower density subdural fluid collection along the high left falx measuring 6-7 mm in thickness. Vascular: No hyperdense vessel or unexpected calcification. Skull: Normal. Negative for fracture or focal lesion. Oval focal postsurgical defect over the left frontal parietal skull. Other: None. CT MAXILLOFACIAL FINDINGS Osseous: No evidence of facial bone fracture. Orbits: Negative. No traumatic or inflammatory finding. Sinuses: Paranasal sinuses are well developed and well aerated without air-fluid level. Subtle focal opacification over the right ethmoid air cells and small mucous retention cyst over the anterior floor the left maxillary sinus. Ostiomeatal complexes are patent. Visualized mastoid air cells are clear. Soft tissues: No focal soft tissue injury. CT CERVICAL SPINE FINDINGS Alignment: Normal. Skull base and vertebrae: Vertebral body heights are normal. Minimal spondylosis throughout  the cervical spine. Atlantoaxial articulation is unremarkable. No evidence of acute fracture or traumatic subluxation. Soft tissues and spinal canal: No prevertebral fluid or swelling. No visible canal hematoma. Disc levels:  No significant disc space narrowing. Upper chest: Enlarged heterogeneous goiter. Other: None. IMPRESSION: 1. Moderate acute subdural hematoma over the left convexity most prominent over the temporal region with there is focal bulbous prominence of this hematoma measuring 2 cm in thickness. This subdural hematoma appears to communicate with an area of acute parenchymal hemorrhage over the left temporal lobe measuring 2.2 x 3.1 cm. There is minimal associated acute subarachnoid hemorrhage over the left temporal region. Small amount of acute subdural hemorrhage over the anterior interhemispheric fissure as well as left tentorium. Local mass effect over the left convexity and mild midline shift to the right of 5-6 mm. 2. 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 Postsurgical defect over the left frontal parietal skull. 3.  No acute facial bone fracture. 4.  No acute cervical spine injury. 5. Enlarged heterogeneous thyroid likely goiter. Recommend thyroid ultrasound (ref: J Am Coll Radiol. 2015 Feb;12(2): 143-50). Critical Value/emergent results were called by telephone at the time of interpretation on 12/19/2019 at 11:08 am to provider Dr. Barbie Banner, who verbally acknowledged these results. Electronically Signed   By: Elberta Fortis M.D.   On: 12/19/2019 11:09   CT Cervical Spine Wo Contrast  Result Date: 12/19/2019  CLINICAL DATA:  Encephalopathy.  Trauma. EXAM: CT HEAD WITHOUT CONTRAST CT MAXILLOFACIAL WITHOUT CONTRAST CT CERVICAL SPINE WITHOUT CONTRAST TECHNIQUE: Multidetector CT imaging of the head, cervical spine, and maxillofacial structures were performed using the standard protocol without intravenous contrast.  Multiplanar CT image reconstructions of the cervical spine and maxillofacial structures were also generated. COMPARISON:  None. FINDINGS: CT HEAD FINDINGS Brain: Examination demonstrates an acute left subdural hematoma most prominent over the temporal region rib there is a focal bulbous area measuring approximately 2 cm in thickness. This focal masslike area of acute subdural hemorrhage appears to communicate with a parenchymal component of acute hemorrhage over the posterior left temporal region measuring 2.2 x 3.1 cm. There is a component of acute subdural hemorrhage over the left side of the anterior interhemispheric fissure. This also a component of acute subarachnoid hemorrhage over the left tentorium. There is mild local mass effect over the left hemisphere with midline shift to the right of approximately 5-6 mm. Possible mild associated white matter edema over the left parietal/posterior temporal region. Subtle focal areas of acute subarachnoid hemorrhage over the left temporal region. Small lower density subdural fluid collection along the high left falx measuring 6-7 mm in thickness. Vascular: No hyperdense vessel or unexpected calcification. Skull: Normal. Negative for fracture or focal lesion. Oval focal postsurgical defect over the left frontal parietal skull. Other: None. CT MAXILLOFACIAL FINDINGS Osseous: No evidence of facial bone fracture. Orbits: Negative. No traumatic or inflammatory finding. Sinuses: Paranasal sinuses are well developed and well aerated without air-fluid level. Subtle focal opacification over the right ethmoid air cells and small mucous retention cyst over the anterior floor the left maxillary sinus. Ostiomeatal complexes are patent. Visualized mastoid air cells are clear. Soft tissues: No focal soft tissue injury. CT CERVICAL SPINE FINDINGS Alignment: Normal. Skull base and vertebrae: Vertebral body heights are normal. Minimal spondylosis throughout the cervical spine.  Atlantoaxial articulation is unremarkable. No evidence of acute fracture or traumatic subluxation. Soft tissues and spinal canal: No prevertebral fluid or swelling. No visible canal hematoma. Disc levels:  No significant disc space narrowing. Upper chest: Enlarged heterogeneous goiter. Other: None. IMPRESSION: 1. Moderate acute subdural hematoma over the left convexity most prominent over the temporal region with there is focal bulbous prominence of this hematoma measuring 2 cm in thickness. This subdural hematoma appears to communicate with an area of acute parenchymal hemorrhage over the left temporal lobe measuring 2.2 x 3.1 cm. There is minimal associated acute subarachnoid hemorrhage over the left temporal region. Small amount of acute subdural hemorrhage over the anterior interhemispheric fissure as well as left tentorium. Local mass effect over the left convexity and mild midline shift to the right of 5-6 mm. 2. 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 Postsurgical defect over the left frontal parietal skull. 3.  No acute facial bone fracture. 4.  No acute cervical spine injury. 5. Enlarged heterogeneous thyroid likely goiter. Recommend thyroid ultrasound (ref: J Am Coll Radiol. 2015 Feb;12(2): 143-50). Critical Value/emergent results were called by telephone at the time of interpretation on 12/19/2019 at 11:08 am to provider Dr. Barbie Bannererantolo, who verbally acknowledged these results. Electronically Signed   By: Elberta Fortisaniel  Boyle M.D.   On: 12/19/2019 11:09   DG Chest Portable 1 View  Result Date: 12/19/2019 CLINICAL DATA:  Initial evaluation for acute altered mental status. EXAM: PORTABLE CHEST 1 VIEW COMPARISON:  Prior radiograph from 08/02/2008. FINDINGS: Patient rotated to the left. Left-sided Port-A-Cath  in place with tip overlying the cavoatrial junction. Mild cardiomegaly. Mediastinal silhouette normal. Aortic atherosclerosis.  Lungs hypoinflated. 19 mm nodular density seen overlying the right infrahilar region, suspected reflect a portion of the right pulmonary arteries, although underlying nodule not excluded. No focal infiltrates. No edema or effusion. No pneumothorax. No acute osseous finding. IMPRESSION: 1. 19 mm nodular density overlying the right infrahilar region, suspected to be vascular in nature related to the underlying right pulmonary arteries. A possible underlying nodule/mass could also have this appearance. Further assessment with dedicated cross-sectional imaging of the chest suggested for further evaluation. 2. Mild cardiomegaly. No other active cardiopulmonary disease. Electronically Signed   By: Rise Mu M.D.   On: 12/19/2019 06:25   DG Shoulder Left Portable  Result Date: 12/19/2019 CLINICAL DATA:  Bruising to shoulder EXAM: LEFT SHOULDER COMPARISON:  None. FINDINGS: Alignment is anatomic on this single view. There is no acute fracture. Joint space is preserved. Chest findings dictated separately. IMPRESSION: Negative. Electronically Signed   By: Guadlupe Spanish M.D.   On: 12/19/2019 07:06   DG Shoulder Right Portable  Result Date: 12/19/2019 CLINICAL DATA:  Shoulder bruising EXAM: PORTABLE RIGHT SHOULDER COMPARISON:  None. FINDINGS: There is alignment at the right glenohumeral joint on this single view. No acute fracture. Deformity and remodeling of the mid right humerus secondary to prior fracture. Joint space appears preserved. Chest findings dictated separately. IMPRESSION: No acute osseous abnormality. Electronically Signed   By: Guadlupe Spanish M.D.   On: 12/19/2019 07:07   CT Maxillofacial Wo Contrast  Result Date: 12/19/2019 CLINICAL DATA:  Encephalopathy.  Trauma. EXAM: CT HEAD WITHOUT CONTRAST CT MAXILLOFACIAL WITHOUT CONTRAST CT CERVICAL SPINE WITHOUT CONTRAST TECHNIQUE: Multidetector CT imaging of the head, cervical spine, and maxillofacial structures were performed using the standard  protocol without intravenous contrast. Multiplanar CT image reconstructions of the cervical spine and maxillofacial structures were also generated. COMPARISON:  None. FINDINGS: CT HEAD FINDINGS Brain: Examination demonstrates an acute left subdural hematoma most prominent over the temporal region rib there is a focal bulbous area measuring approximately 2 cm in thickness. This focal masslike area of acute subdural hemorrhage appears to communicate with a parenchymal component of acute hemorrhage over the posterior left temporal region measuring 2.2 x 3.1 cm. There is a component of acute subdural hemorrhage over the left side of the anterior interhemispheric fissure. This also a component of acute subarachnoid hemorrhage over the left tentorium. There is mild local mass effect over the left hemisphere with midline shift to the right of approximately 5-6 mm. Possible mild associated white matter edema over the left parietal/posterior temporal region. Subtle focal areas of acute subarachnoid hemorrhage over the left temporal region. Small lower density subdural fluid collection along the high left falx measuring 6-7 mm in thickness. Vascular: No hyperdense vessel or unexpected calcification. Skull: Normal. Negative for fracture or focal lesion. Oval focal postsurgical defect over the left frontal parietal skull. Other: None. CT MAXILLOFACIAL FINDINGS Osseous: No evidence of facial bone fracture. Orbits: Negative. No traumatic or inflammatory finding. Sinuses: Paranasal sinuses are well developed and well aerated without air-fluid level. Subtle focal opacification over the right ethmoid air cells and small mucous retention cyst over the anterior floor the left maxillary sinus. Ostiomeatal complexes are patent. Visualized mastoid air cells are clear. Soft tissues: No focal soft tissue injury. CT CERVICAL SPINE FINDINGS Alignment: Normal. Skull base and vertebrae: Vertebral body heights are normal. Minimal spondylosis  throughout the cervical spine. Atlantoaxial articulation is unremarkable. No evidence  of acute fracture or traumatic subluxation. Soft tissues and spinal canal: No prevertebral fluid or swelling. No visible canal hematoma. Disc levels:  No significant disc space narrowing. Upper chest: Enlarged heterogeneous goiter. Other: None. IMPRESSION: 1. Moderate acute subdural hematoma over the left convexity most prominent over the temporal region with there is focal bulbous prominence of this hematoma measuring 2 cm in thickness. This subdural hematoma appears to communicate with an area of acute parenchymal hemorrhage over the left temporal lobe measuring 2.2 x 3.1 cm. There is minimal associated acute subarachnoid hemorrhage over the left temporal region. Small amount of acute subdural hemorrhage over the anterior interhemispheric fissure as well as left tentorium. Local mass effect over the left convexity and mild midline shift to the right of 5-6 mm. 2. 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 Postsurgical defect over the left frontal parietal skull. 3.  No acute facial bone fracture. 4.  No acute cervical spine injury. 5. Enlarged heterogeneous thyroid likely goiter. Recommend thyroid ultrasound (ref: J Am Coll Radiol. 2015 Feb;12(2): 143-50). Critical Value/emergent results were called by telephone at the time of interpretation on 12/19/2019 at 11:08 am to provider Dr. Barbie Banner, who verbally acknowledged these results. Electronically Signed   By: Elberta Fortis M.D.   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 - there is some mass effect from her temporal lobe hematoma and SDH but as she is a poor surgical candidate and these hemorrhages are not large, she would fare better with nonoperative  management. - keep SBP < 150 mmHg - Keppra x 7 days - continued close neuro checks for now    Bedelia Person 12/20/2019, 11:30 AM

## 2019-12-20 NOTE — Progress Notes (Signed)
eLink Physician-Brief Progress Note Patient Name: Elizabeth Shepherd DOB: 1955-10-24 MRN: [id number]   Date of Service  12/20/2019  HPI/Events of Note  Pt very agitated on current dose of Precedex. She's also refusing PO medications.  eICU Interventions  Increase Dex ceiling to 1.4 mcg, keep Pt NPO overnight and reassess in a.m.        Deidrea Gaetz U Maty Zeisler 12/20/2019, 1:05 AM

## 2019-12-20 NOTE — Progress Notes (Signed)
RN's attempts to insert NG tube failed; patient is too combative even with restraints and sedation. Physician notified, RN will continue to monitor.

## 2019-12-20 NOTE — Progress Notes (Signed)
Hypertonic saline started 4/10 at 1412 in the R FA PIV at 75 mL/hr by this RN.  Hypertonic saline line switched 4/11 at 1400 to lower L FA PIV at 75 mL/hr by this RN.

## 2019-12-20 NOTE — Progress Notes (Signed)
Patient continues to have reoccurring episodes of intense anxiety where she thinks her mother is trying to kill or hurt her. Patient is redirectable when assured she is safe and that her mother is not here. Once redirected patient took all nighttime medicine and went back to sleep. RN will continue to monitor.

## 2019-12-20 NOTE — Progress Notes (Signed)
STROKE TEAM PROGRESS NOTE   HISTORY OF PRESENT ILLNESS (per record) Elizabeth Shepherd is an 64 y.o. 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 Patient was found at a hotel with [place]. She was knocking on doors. According to her daughter patient fell about 2 days ago. She was slow to get up but was able to get up. Treated for C.diff and MSSA in December 2020 for which she is on long term antibiotics ( she has/ PICC for OP infusions of Vancomycin) Intracerebral Hemorrhage (ICH) Score Total:  0 ED course:  CTH: 1. Moderate acute subdural hematoma over the left convexity most prominent over the temporal region with there is focal bulbous prominence of this hematoma measuring 2 cm in thickness. This subdural hematoma appears to communicate with an area of acute parenchymal hemorrhage over the left temporal lobe measuring 2.2 x 3.1 cm. There is minimal associated acute subarachnoid hemorrhage over the left temporal region. Small amount of acute subdural hemorrhage over the anterior interhemispheric fissure as well as left tentorium. Local mass effect over the left convexity and mild midline shift to the right of 5-6 mm. UA: negative for UTI, UDS: + benzo's, TSH: WNL, ammonia: 39, ETOH: < 10 CBG: 104 PT: 51.0 INR: 5.6 BP: 106/77 Kcentra and vitamin K given to reverse coumadin Patient was given Haldol, Geodon, Ativan, Ketamin and started on a precedex drip ( now off) for her agitation upon arrival   INTERVAL HISTORY Her  RN is at the bedside.  I have personally reviewed history of presenting illness and details, electronic medical records in [place] and imaging films in PACS.  Patient is awake but aphasic and confused and disoriented.  She follows simple midline and one-step commands.  Speech is nonfluent.  She is relatively hypotensive.  No obvious witnessed seizure activity.   She is on hypertonic saline at 75 cc an hour with serum sodium is 147 only this morning.   OBJECTIVE Vitals:   12/20/19 0500 12/20/19 0600 12/20/19 0700 12/20/19 0800  BP: (!) 90/59 (!) 86/63 92/64 100/65  Pulse: (!) 55 (!) 56 (!) 57 61  Resp: 18 18 15 16   Temp:    (!) 97.4 F (36.3 C)  TempSrc:    Oral  SpO2: 96% 94% 97% 98%  Weight:      Height:        CBC:  Recent Labs  Lab 12/19/19 0625 12/19/19 0625 12/19/19 0635 12/20/19 0528  WBC 5.0  --   --  3.8*  NEUTROABS 3.3  --   --   --   HGB 12.6   < > 12.2 9.1*  HCT 40.8   < > 36.0 28.6*  MCV 77.3*  --   --  76.9*  PLT 223  --   --  183   < > = values in this interval not displayed.    Basic Metabolic Panel:  Recent Labs  Lab 12/19/19 0625 12/19/19 0625 12/19/19 0635 12/20/19 0528  NA 134*   < > 137 147*  K 3.7   < > 3.7 3.3*  CL 109  --   --  126*  CO2 11*  --   --  14*  GLUCOSE 115*  --   --  178*  BUN 24*  --   --  18  CREATININE 1.56*  --   --  0.93  CALCIUM 10.5*  --   --  9.3   < > = values in this interval not displayed.    Lipid Panel: No results found for: CHOL, TRIG, HDL, CHOLHDL, VLDL, LDLCALC HgbA1c: No results found for: HGBA1C Urine Drug Screen:     Component Value Date/Time   LABOPIA NONE DETECTED 12/19/2019 0734   COCAINSCRNUR NONE DETECTED 12/19/2019 0734   LABBENZ POSITIVE (A) 12/19/2019 0734   AMPHETMU NONE DETECTED 12/19/2019 0734   THCU NONE DETECTED 12/19/2019 0734   LABBARB NONE DETECTED 12/19/2019 0734    Alcohol Level     Component Value Date/Time   ETH <10 12/19/2019 0625    IMAGING  CT HEAD WO CONTRAST 12/19/2019 IMPRESSION:  1. Stable extensive left side subdural hematoma, lobulated and ranging from 7 mm to 20 mm in thickness.  2. Stable left temporal lobe hemorrhagic contusion. Stable small volume left para falcine subdural blood.  3. Stable intracranial mass effect with rightward midline shift at 7 mm.  4. No new intracranial abnormality.  5. Mild bilateral  mastoid effusions. No acute skull fracture identified.    CT Head Wo Contrast CT Maxillofacial Wo Contrast 12/19/2019 IMPRESSION:  1. Moderate acute subdural hematoma over the left convexity most prominent over the temporal region with there is focal bulbous prominence of this hematoma measuring 2 cm in thickness. This subdural hematoma appears to communicate with an area of acute parenchymal hemorrhage over the left temporal lobe measuring 2.2 x 3.1 cm. There is minimal associated acute subarachnoid hemorrhage over the left temporal region. Small amount of acute subdural hemorrhage over the anterior interhemispheric fissure as well as left tentorium. Local mass effect over the left convexity and mild midline shift to the right of 5-6 mm.  2. 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 Postsurgical defect over the left frontal parietal skull.  3.  No acute facial bone fracture.  4.  No acute cervical spine injury.  5. Enlarged heterogeneous thyroid likely goiter. Recommend thyroid ultrasound (ref: J Am Coll Radiol. 2015 Feb;12(2): 143-50).   CT Head Wo Contrast - pending 12/20/19   CT Cervical Spine Wo Contrast 12/19/2019 IMPRESSION:  No acute cervical spine injury.   DG Chest Portable 1 View 12/19/2019 IMPRESSION:  1. 19 mm nodular density overlying the right infrahilar region, suspected to be vascular in nature related to the underlying right pulmonary arteries. A possible underlying nodule/mass could also have this appearance. Further assessment with dedicated cross-sectional imaging of the chest suggested for further evaluation.  2. Mild cardiomegaly. No other active cardiopulmonary disease.   DG Shoulder Left Portable 12/19/2019 IMPRESSION:  Negative.  DG Shoulder Right Portable 12/19/2019 IMPRESSION:  No acute osseous abnormality.    ECG - ST rate 101 BPM. (See cardiology reading for complete  details)   PHYSICAL EXAM Blood pressure 100/65, pulse 61, temperature (!) 97.4 F (36.3 C), temperature source Oral, resp. rate 16, height 5\' 8"  (1.727 m), weight 77.1 kg, SpO2 98 %. Pleasant middle-age African-American lady not in distress. . Afebrile. Head is nontraumatic. Neck is supple without bruit.    Cardiac exam no murmur or gallop. Lungs are clear to auscultation. Distal pulses are well felt.  Neurological Exam :  Patient is awake and alert.  She appears globally aphasic with nonfluent speech but able to answers simple questions and speaks a few words and short sentences.  She has trouble with naming and repetition.  Follows only simple one-step commands.  She is disoriented to place and person.  Extraocular movements are full range without nystagmus.  She blinks to threat more on the left than the right.  Right lower facial weakness.  Tongue midline.  Motor system exam she is able to move all 4 extremities equally well against gravity but will not cooperate if for detailed muscle testing.  Sensation appears preserved bilaterally.  Plantars are downgoing.  Gait not tested  ASSESSMENT/PLAN Elizabeth Shepherd is a 64 y.o. 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  She did not receive IV t-PA due to ICH.  Extensive left side subdural hematoma  Resultant altered mental status and aphasia  Code Stroke CT Head - not ordered  CT head - 12/19/19 - Stable extensive left side subdural hematoma, lobulated and ranging from 7 mm to 20 mm in thickness. Stable left temporal lobe hemorrhagic contusion. Stable small volume left para falcine subdural blood. Stable intracranial mass effect with rightward midline shift  at 7 mm.   CT Head - 12/19/19 - Moderate acute subdural hematoma over the left convexity most prominent over the temporal region with there is focal bulbous prominence of this hematoma measuring 2 cm in thickness. This subdural hematoma appears to communicate with an area of acute parenchymal hemorrhage over the left temporal lobe measuring 2.2 x 3.1 cm. There is minimal associated acute subarachnoid hemorrhage over the left temporal region. Small amount of acute subdural hemorrhage over the anterior interhemispheric fissure as well as left tentorium. Local mass effect over the left convexity and mild midline shift to the right of 5-6 mm. 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 Postsurgical defect over the left frontal parietal skull.  CT Head - 12/20/19 - pending  MRI head - not ordered  MRA head - not ordered  CTA H&N - not ordered  CT Perfusion - not ordered  Carotid Doppler - not ordered  2D Echo - not ordered  [employer] Virus 2 - negative  LDL - not ordered  HgbA1c - not ordered  UDS - benzodiazepine (home meds include ativan)  VTE prophylaxis - SCDs Diet  Diet Order            Diet NPO time specified Except for: Sips with Meds  Diet effective now               warfarin daily prior to admission, now on No antithrombotic  Ongoing aggressive stroke risk factor management  Therapy recommendations:  pending  Disposition:  Pending  Hypertension  Home BP meds: none (aldactone)  Current BP meds: none (diuretics)  Pt is mildly hypotensive . SBP goal < 140 mm Hg initially . Long-term BP goal normotensive  Other Stroke Risk Factors  Advanced age  Former cigarette smoker - quit  ETOH use, advised to drink no more than 1 alcoholic beverage per day.   Atrial fibrillation  Substance Abuse  Other Active Problems  Code status - Full code  Coumadin PTA for venous  thromboembolism (INR 5.6 on admission) - reversed with Kcentra and Vitamin K ->INR1.4  CT Head - Enlarged heterogeneous thyroid likely goiter. Recommend thyroid ultrasound  CKD stage 3b - Creatinine - 1.56->0.93  Elevated ammonia - 39  Abnormal CXR - 19  mm nodular density overlying the right infrahilar region, suspected to be vascular in nature related to the underlying right pulmonary arteries. A possible underlying nodule/mass could also have this appearance. Further assessment with dedicated cross-sectional imaging of the chest suggested for further evaluation.   Anemia - Hb - 12.6->12.2->9.1  Hypokalemia - 3.7->3.3  Mild bradycardia - 50's   Hospital day # 1 Patient presented with altered mental status secondary to large acute on chronic left subdural hematoma along with hemorrhagic contusion component as well following a fall to 3 days ago.  She has remote history of left-sided subdural hematoma in 2016 s/p surgery.  Being on warfarin would have also contributed to this.  INR has been reversed with Kcentra and is stable at 1.4 this morning.  Agree with neurosurgery she needs conservative medical treatment rather than surgery which has extensive risk of morbidity given the size of her hemorrhage .  Continue hypertonic saline drip at 75 cc an hour with serum sodium goal 1 50-1 55.  Repeat CT scan of the head tomorrow morning.  Start valproic acid 5 cc every 8 hourly for agitation as well as seizure prevention.  Long discussion with patient and RN as well as Dr. Everardo All and Dr. Maisie Fus and answered questions.  No family available at the bedside for discussion during rounds This patient is critically ill and at significant risk of neurological worsening, death and care requires constant monitoring of vital signs, hemodynamics,respiratory and cardiac monitoring, extensive review of multiple databases, frequent neurological assessment, discussion with family, other specialists and medical decision  making of high complexity.I have made any additions or clarifications directly to the above note.This critical care time does not reflect procedure time, or teaching time or supervisory time of PA/NP/Med Resident etc but could involve care discussion time.  I spent 30 minutes of neurocritical care time  in the care of  this patient. Delia Heady, MD  To contact Stroke Continuity provider, please refer to [email or web address]. After hours, contact General Neurology

## 2019-12-20 NOTE — Progress Notes (Addendum)
NAME:  Elizabeth Shepherd, MRN:  [id number], DOB:  Feb 02, 1956, LOS: 1 ADMISSION DATE:  12/19/2019 REFERRING MD:  Renae Fickle Physician, CHIEF COMPLAINT: Altered mental status  Brief History   Patient was brought into the emergency room following being found knocking on people's doors at a hotel According to daughter, patient fell about 2 days ago, was slow to get up but was able to get up unable to move around Did not exhibit any weakness on any side, was slow but appropriate Patient was wandering around and knocking on people's doors was why she was brought into the hospital early this morning She had had a subdural bleed in the past about 3 years ago managed at [facility]: [facility] Rhonda W Patient primarily resides in [place] Herself and her daughter were on a trip to go to [place] to see pts physician who was managing her pulmonary hypertension Treated for C. difficile in December 2020  On an infusion of Veletri  Past Medical History  History of subdural hemorrhage History of pulmonary hypertension Ataxia Chronic pain syndrome Chronic respiratory failure on home oxygen Reformed smoker Depression Hypercoagulable state-history of prothrombin mutation Atrial fibrillation Chronic metabolic acidosis  Significant Hospital Events   Required multiple doses of medications to sedate this morning as she was very agitated-ketamine, Geodon, benzodiazepines  Consults:  Neurosurgery Neurology  Procedures:  None   Significant Diagnostic Tests:  CT Head 12/20/19@11 :09  IMPRESSION: 1. Moderate acute subdural hematoma over the left convexity most prominent over the temporal region with there is focal bulbous prominence of this hematoma measuring 2 cm in thickness. This subdural hematoma appears to communicate with an area of acute parenchymal hemorrhage over the left temporal lobe measuring 2.2 x 3.1 cm. There is minimal associated acute subarachnoid hemorrhage over the left temporal region. Small amount of  acute subdural hemorrhage over the anterior interhemispheric fissure as well as left tentorium. Local mass effect over the left convexity and mild midline shift to the right of 5-6 mm.   2. 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 Postsurgical defect over the left frontal parietal skull.   3.  No acute facial bone fracture.   4.  No acute cervical spine injury.   5. Enlarged heterogeneous thyroid likely goiter. Recommend thyroid ultrasound (ref: J Am Coll Radiol. 2015 Feb;12(2): 143-50).  CT Head 12/19/19 @15 :29 IMPRESSION: 1. Stable extensive left side subdural hematoma, lobulated and ranging from 7 mm to 20 mm in thickness. 2. Stable left temporal lobe hemorrhagic contusion. Stable small volume left para falcine subdural blood. 3. Stable intracranial mass effect with rightward midline shift at 7 mm. 4. No new intracranial abnormality. 5. Mild bilateral mastoid effusions. No acute skull fracture identified.  Micro Data:   SARS coronavirus negative Influenza A and B negative  Antimicrobials:  None  Interim history/subjective:  Comfortably sedated on Precedex. Reported to be agitated overnight however currently compliant.   Repeat CT head stable  Objective   Blood pressure (!) 86/63, pulse (!) 56, temperature (!) 97 F (36.1 C), temperature source Axillary, resp. rate 18, height 5\' 8"  (1.727 m), weight 77.1 kg, SpO2 94 %.        Intake/Output Summary (Last 24 hours) at 12/20/2019 0750 Last data filed at 12/20/2019 0600 Gross per 24 hour  Intake 2439.74 ml  Output 550 ml  Net 1889.74 ml   Filed Weights   12/19/19 0422  Weight: 77.1 kg   Physical Exam: General: Elderly female laying  in bed, no acute distress HENT: [place], AT, OP clear, MMM Eyes: EOMI, no scleral icterus Respiratory: Clear to auscultation bilaterally.  No crackles, wheezing or rales Cardiovascular: RRR, -M/R/G, no  JVD GI: BS+, soft, nontender Extremities:-Edema,-tenderness Neuro: Drowsy, follows commands, moves extremities x 4  Resolved Hospital Problem list     Assessment & Plan:   Altered mental status - UDS + benzos otherwise tox work-up negative Agitation requiring sedation Wean Precedex for goal 0 Ok to restart gabapentin at reduced dose starting tomorrow  Acute left subdural hematoma in setting of anticoagulation Acute encephalopathy secondary to SDH Possibly acute on chronic subdural hematoma S/p Kcentral and Vitamin K. INR normalized Repeat CT head stable -NSG and Neuro following. Not a surgical candidate at this time -Continue hypertonic saline per Neuro. Trend Na q6h -Goal SBP <140 -Hold anticoagulation -BP controlled  Coagulopathy - INR normalized -Received Kcentra  -Received vitamin K  -Coumadin on hold  Chronic hypoxemic respiratory failure -On 2 to 3 L of oxygen -Monitor closely -Oxygen supplementation as needed  Idiopathic pulmonary arterial hypertension Managed by physician in [place] -Bosentan (Tracleer) 125 mg PO BID  -Tadalafil (Alyq/Advirca) 40 mg daily at bedtime  -Epoprostenol (Veletri) 60,000 ng/mL (100 mL) - running at 94 mL/day -Continue home lasix and spironlactone  Hypokalemia -Continue home supplement -Daily BMP  Acute on Chronic kidney disease - resolved -Avoid nephrotoxic agents -Continue home sodium bicarbonate -Trend UOP/Cr  History of depression -Monitor Ataxia Chronic pain syndrome -As needed pain medications  Risk with surgery and pts  severe pulmonary hypertension-recovery and rehabilitation will be very challenging  Best practice:  Diet: Advance as tolerated Pain/Anxiety/Delirium protocol (if indicated): Precedex, Versed VAP protocol (if indicated): Not applicable DVT prophylaxis: SCD GI prophylaxis: PPI Glucose control: Will monitor Mobility: Bedrest Code Status: Full code Family Communication: Updated husband  4/11 Disposition: Neuro ICU  Labs   CBC: Recent Labs  Lab 12/19/19 0625 12/19/19 0635 12/20/19 0528  WBC 5.0  --  3.8*  NEUTROABS 3.3  --   --   HGB 12.6 12.2 9.1*  HCT 40.8 36.0 28.6*  MCV 77.3*  --  76.9*  PLT 223  --  183    Basic Metabolic Panel: Recent Labs  Lab 12/19/19 0625 12/19/19 0635 12/20/19 0528  NA 134* 137 147*  K 3.7 3.7 3.3*  CL 109  --  126*  CO2 11*  --  14*  GLUCOSE 115*  --  178*  BUN 24*  --  18  CREATININE 1.56*  --  0.93  CALCIUM 10.5*  --  9.3   GFR: Estimated Creatinine Clearance: 69.4 mL/min (by C-G formula based on SCr of 0.93 mg/dL). Recent Labs  Lab 12/19/19 0625 12/20/19 0528  WBC 5.0 3.8*  LATICACIDVEN 1.0  --     Liver Function Tests: Recent Labs  Lab 12/19/19 0625  AST 15  ALT 9  ALKPHOS 91  BILITOT 0.3  PROT 8.5*  ALBUMIN 4.0   No results for input(s): LIPASE, AMYLASE in the last 168 hours. Recent Labs  Lab 12/19/19 0625  AMMONIA 39*    ABG    Component Value Date/Time   PHART 7.348 (L) 12/19/2019 0635   PCO2ART 20.6 (L) 12/19/2019 0635   PO2ART 120.0 (H) 12/19/2019 0635   HCO3 11.3 (L) 12/19/2019 0635   TCO2 12 (L) 12/19/2019 0635   ACIDBASEDEF 12.0 (H) 12/19/2019 0635   O2SAT 99.0 12/19/2019 0635     Coagulation Profile: Recent Labs  Lab 12/19/19 0625 12/19/19 1227  12/20/19 0528  INR 5.6* 1.4* 1.4*    Cardiac Enzymes: Recent Labs  Lab 12/19/19 0625  CKTOTAL 181    HbA1C: No results found for: HGBA1C  CBG: Recent Labs  Lab 12/19/19 0436  GLUCAP 104*   The patient is critically ill with multiple organ systems failure and requires high complexity decision making for assessment and support, frequent evaluation and titration of therapies, application of advanced monitoring technologies and extensive interpretation of multiple databases.   Critical Care Time devoted to patient care services described in this note is 35 Minutes.   Rodman Pickle, M.D. [facility] Pulmonary/Critical Care  Medicine 12/20/2019 7:51 AM   Please see Amion for pager number to reach on-call Pulmonary and Critical Care Team.

## 2019-12-21 ENCOUNTER — Inpatient Hospital Stay (HOSPITAL_COMMUNITY): Payer: Medicare HMO

## 2019-12-21 DIAGNOSIS — G9340 Encephalopathy, unspecified: Secondary | ICD-10-CM

## 2019-12-21 DIAGNOSIS — R4689 Other symptoms and signs involving appearance and behavior: Secondary | ICD-10-CM

## 2019-12-21 DIAGNOSIS — I629 Nontraumatic intracranial hemorrhage, unspecified: Secondary | ICD-10-CM

## 2019-12-21 LAB — SODIUM
Sodium: 153 mmol/L — ABNORMAL HIGH (ref 135–145)
Sodium: 157 mmol/L — ABNORMAL HIGH (ref 135–145)
Sodium: 159 mmol/L — ABNORMAL HIGH (ref 135–145)
Sodium: 159 mmol/L — ABNORMAL HIGH (ref 135–145)

## 2019-12-21 LAB — VALPROIC ACID LEVEL: Valproic Acid Lvl: 54 ug/mL (ref 50.0–100.0)

## 2019-12-21 LAB — BASIC METABOLIC PANEL
BUN: 16 mg/dL (ref 8–23)
CO2: 16 mmol/L — ABNORMAL LOW (ref 22–32)
Calcium: 9.8 mg/dL (ref 8.9–10.3)
Chloride: >130 mmol/L (ref 98–111)
Creatinine, Ser: 0.95 mg/dL (ref 0.44–1.00)
GFR calc Af Amer: >60 mL/min (ref >60)
GFR calc non Af Amer: >60 mL/min (ref >60)
Glucose, Bld: 141 mg/dL — ABNORMAL HIGH (ref 70–99)
Potassium: 3.4 mmol/L — ABNORMAL LOW (ref 3.5–5.1)
Sodium: 157 mmol/L — ABNORMAL HIGH (ref 135–145)

## 2019-12-21 LAB — CBC
HCT: 28.7 % — ABNORMAL LOW (ref 36.0–46.0)
Hemoglobin: 9 g/dL — ABNORMAL LOW (ref 12.0–15.0)
MCH: 24.8 pg — ABNORMAL LOW (ref 26.0–34.0)
MCHC: 31.4 g/dL (ref 30.0–36.0)
MCV: 79.1 fL — ABNORMAL LOW (ref 80.0–100.0)
Platelets: 183 10*3/uL (ref 150–400)
RBC: 3.63 MIL/uL — ABNORMAL LOW (ref 3.87–5.11)
RDW: 18.6 % — ABNORMAL HIGH (ref 11.5–15.5)
WBC: 4.4 10*3/uL (ref 4.0–10.5)
nRBC: 0 % (ref 0.0–0.2)

## 2019-12-21 LAB — PROTIME-INR
INR: 1.4 — ABNORMAL HIGH (ref 0.8–1.2)
Prothrombin Time: 17 seconds — ABNORMAL HIGH (ref 11.4–15.2)

## 2019-12-21 LAB — MAGNESIUM: Magnesium: 1.8 mg/dL (ref 1.7–2.4)

## 2019-12-21 MED ORDER — DIPHENHYDRAMINE HCL 50 MG/ML IJ SOLN
25.0000 mg | Freq: Once | INTRAMUSCULAR | Status: AC
  Administered 2019-12-21: 25 mg via INTRAVENOUS
  Filled 2019-12-21: qty 1

## 2019-12-21 MED ORDER — GABAPENTIN 800 MG PO TABS: 400.0000 mg | ORAL_TABLET | Freq: Three times a day (TID) | ORAL | Status: DC

## 2019-12-21 MED ORDER — SODIUM CHLORIDE 3 % IV SOLN
INTRAVENOUS | Status: DC
  Filled 2019-12-21: qty 500

## 2019-12-21 NOTE — Progress Notes (Signed)
  Speech Language Pathology Treatment: Dysphagia  Patient Details Name: Elizabeth Shepherd MRN: [id number] DOB: [birth year] Today's Date: 12/21/2019 Time: 2703-5009 SLP Time Calculation (min) (ACUTE ONLY): 18 min  Assessment / Plan / Recommendation Clinical Impression  Pt continues to present with a primarily cognitive based dysphagia. Pt was observed with thin liquids, purees, and regular solids. Pt consumed 6oz of thin liquids consecutively and bites of puree without any s/sx of aspiration. Pt was observed with prolonged AP transit and mastication with regular solid trials. Following bites of puree and regular solids, pt required a liquid wash to reduce amount of oral residue with Mod-Max cues. Pt remains appropriate for current diet at this time, ensuring she is following general aspiration precautions and has full supervision. The pt will also benefit from a speech-language evaluation to further assess her current cognitive-linguistic functioning.    HPI HPI: 64 yr old from [place] on her way to [place] with family admitted after being found knocking on people's doors at a hotel. Chart reveals that patient fell about 2 days ago, was slow to get up but was able to get up unable to move around. CT moderate acute subdural hematoma over the left convexity most prominent over the temporal region with there is focal bulbous prominence of this hematoma measuring 2 cm in thickness. SDH appears to communicate with an area of acute parenchymal hemorrhage over the left temporal lobe measuring 2.2 x 3.1 cm. Minimal associated acute subarachnoid hemorrhage over the left temporal region. Small amount of acute subdural hemorrhage over the anterior interhemispheric fissure as well as      SLP Plan  Continue with current plan of care       Recommendations  Diet recommendations: Dysphagia 2 (fine chop);Thin liquid Liquids provided via: Straw;Cup Medication Administration: Crushed with puree Supervision: Full  supervision/cueing for compensatory strategies Compensations: Minimize environmental distractions;Slow rate;Small sips/bites Postural Changes and/or Swallow Maneuvers: Seated upright 90 degrees                Oral Care Recommendations: Oral care BID Follow up Recommendations: 24 hour supervision/assistance SLP Visit Diagnosis: Dysphagia, unspecified (R13.10) Plan: Continue with current plan of care       GO              Maudry Mayhew, Student SLP Office: [phone]  12/21/2019, 1:37 PM

## 2019-12-21 NOTE — Progress Notes (Addendum)
1530:  Patient becoming more agitated and aggressive.  Patient kicked Charity fundraiser while trying to administer meds. PRN med given.  RN to continue to monitor.   1630:  Phlebotomist tried to draw labs, patient became increasingly aggressive and combative.  Patient kicking and attempting to grab at staff.  Patient restrained for staff safety.  MD paged.

## 2019-12-21 NOTE — Progress Notes (Signed)
Subjective: Patient reports no headaches.  NAEs o/n  Objective: Vital signs in last 24 hours: Temp:  [96.6 F (35.9 C)-98.1 F (36.7 C)] 97.1 F (36.2 C) (04/12 0800) Pulse Rate:  [56-67] 62 (04/12 0900) Resp:  [10-26] 26 (04/12 0900) BP: (75-113)/(48-68) 87/57 (04/12 0900) SpO2:  [93 %-100 %] 97 % (04/12 0900)  Intake/Output from previous day: 04/11 0701 - 04/12 0700 In: 2823.8 [P.O.:480; I.V.:2144; IV Piggyback:199.8] Out: 1650 [Urine:1650] Intake/Output this shift: Total I/O In: 119.1 [I.V.:119.1] Out: -  Eyes open to voice, PERRL.  Confused.  Oriented to person.  Speech with some receptive dysphasias, difficulty with complex commands. FC x 4 and centrally.  Some pronator drift in RUE seen  Lab Results: Recent Labs    12/20/19 0528 12/21/19 0248  WBC 3.8* 4.4  HGB 9.1* 9.0*  HCT 28.6* 28.7*  PLT 183 183   BMET Recent Labs    12/20/19 0528 12/20/19 1130 12/20/19 2348 12/21/19 0248  NA 147*   < > 153* 157*  K 3.3*  --   --  3.4*  CL 126*  --   --  >130*  CO2 14*  --   --  16*  GLUCOSE 178*  --   --  141*  BUN 18  --   --  16  CREATININE 0.93  --   --  0.95  CALCIUM 9.3  --   --  9.8   < > = values in this interval not displayed.    Studies/Results: CT Head Wo Contrast  Result Date: 12/21/2019 CLINICAL DATA:  Follow-up subdural hematoma EXAM: CT HEAD WITHOUT CONTRAST TECHNIQUE: Contiguous axial images were obtained from the base of the skull through the vertex without intravenous contrast. COMPARISON:  Two days ago FINDINGS: Brain: Parenchymal hemorrhage with surrounding edema in the left posterior temporal lobe, unchanged at 15 x 23 mm on coronal reformats. There is subdural hematoma along the left cerebral convexity greatest laterally but also seen along the tentorium and interhemispheric fissure. At the level of the contusion the subdural hemorrhage measures up to 2 cm in thickness. Midline shift measures 4 mm. No entrapment or infarct. Vascular: Negative  Skull: No acute finding.  Remote left frontal burr hole. Sinuses/Orbits: Negative IMPRESSION: Unchanged left temporal lobe contusion and left cerebral convexity subdural hemorrhage. Electronically Signed   By: Marnee Spring M.D.   On: 12/21/2019 06:33   CT HEAD WO CONTRAST  Result Date: 12/19/2019 CLINICAL DATA:  64 year old female status post trauma with intracranial hemorrhage. EXAM: CT HEAD WITHOUT CONTRAST TECHNIQUE: Contiguous axial images were obtained from the base of the skull through the vertex without intravenous contrast. COMPARISON:  Noncontrast head CT 0951 hours today. FINDINGS: Brain: Wide ranging hyperdense left side subdural hematoma is generally 5-7 mm thickness along the left hemisphere, although focal lobulations of blood along the left temporal lobe and operculum are up to 20 mm and stable. Small volume left para falcine subdural blood with both hyperdense and hypodense components appears stable. Superimposed posterior left temporal lobe hemorrhagic contusion with surrounding edema is stable (series 3, image 15). There could be additional smaller hemorrhagic contusion in the left anterior temporal lobe. Rightward midline shift is stable at 7 mm. Stable effaced left lateral ventricle. No ventricular trapping. Stable blood along the left tentorium. Basilar cisterns are patent and stable. No new or progressive intracranial hemorrhage identified. No acute cortically based infarct. Vascular: Mild Calcified atherosclerosis at the skull base. Skull: Chronic left superior frontal convexity burr hole. No acute  osseous abnormality identified. Sinuses/Orbits: Mild left mastoid effusion appears stable and the left tympanic cavity remains clear. Smaller right inferior mastoid effusion, also stable with clear right tympanic cavity. Mild paranasal sinus mucosal thickening. Other: No acute orbit or scalp soft tissue findings. IMPRESSION: 1. Stable extensive left side subdural hematoma, lobulated and  ranging from 7 mm to 20 mm in thickness. 2. Stable left temporal lobe hemorrhagic contusion. Stable small volume left para falcine subdural blood. 3. Stable intracranial mass effect with rightward midline shift at 7 mm. 4. No new intracranial abnormality. 5. Mild bilateral mastoid effusions. No acute skull fracture identified. Electronically Signed   By: Odessa Fleming M.D.   On: 12/19/2019 15:29   CT Head Wo Contrast  Result Date: 12/19/2019 CLINICAL DATA:  Encephalopathy.  Trauma. EXAM: CT HEAD WITHOUT CONTRAST CT MAXILLOFACIAL WITHOUT CONTRAST CT CERVICAL SPINE WITHOUT CONTRAST TECHNIQUE: Multidetector CT imaging of the head, cervical spine, and maxillofacial structures were performed using the standard protocol without intravenous contrast. Multiplanar CT image reconstructions of the cervical spine and maxillofacial structures were also generated. COMPARISON:  None. FINDINGS: CT HEAD FINDINGS Brain: Examination demonstrates an acute left subdural hematoma most prominent over the temporal region rib there is a focal bulbous area measuring approximately 2 cm in thickness. This focal masslike area of acute subdural hemorrhage appears to communicate with a parenchymal component of acute hemorrhage over the posterior left temporal region measuring 2.2 x 3.1 cm. There is a component of acute subdural hemorrhage over the left side of the anterior interhemispheric fissure. This also a component of acute subarachnoid hemorrhage over the left tentorium. There is mild local mass effect over the left hemisphere with midline shift to the right of approximately 5-6 mm. Possible mild associated white matter edema over the left parietal/posterior temporal region. Subtle focal areas of acute subarachnoid hemorrhage over the left temporal region. Small lower density subdural fluid collection along the high left falx measuring 6-7 mm in thickness. Vascular: No hyperdense vessel or unexpected calcification. Skull: Normal. Negative for  fracture or focal lesion. Oval focal postsurgical defect over the left frontal parietal skull. Other: None. CT MAXILLOFACIAL FINDINGS Osseous: No evidence of facial bone fracture. Orbits: Negative. No traumatic or inflammatory finding. Sinuses: Paranasal sinuses are well developed and well aerated without air-fluid level. Subtle focal opacification over the right ethmoid air cells and small mucous retention cyst over the anterior floor the left maxillary sinus. Ostiomeatal complexes are patent. Visualized mastoid air cells are clear. Soft tissues: No focal soft tissue injury. CT CERVICAL SPINE FINDINGS Alignment: Normal. Skull base and vertebrae: Vertebral body heights are normal. Minimal spondylosis throughout the cervical spine. Atlantoaxial articulation is unremarkable. No evidence of acute fracture or traumatic subluxation. Soft tissues and spinal canal: No prevertebral fluid or swelling. No visible canal hematoma. Disc levels:  No significant disc space narrowing. Upper chest: Enlarged heterogeneous goiter. Other: None. IMPRESSION: 1. Moderate acute subdural hematoma over the left convexity most prominent over the temporal region with there is focal bulbous prominence of this hematoma measuring 2 cm in thickness. This subdural hematoma appears to communicate with an area of acute parenchymal hemorrhage over the left temporal lobe measuring 2.2 x 3.1 cm. There is minimal associated acute subarachnoid hemorrhage over the left temporal region. Small amount of acute subdural hemorrhage over the anterior interhemispheric fissure as well as left tentorium. Local mass effect over the left convexity and mild midline shift to the right of 5-6 mm. 2. 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 Postsurgical defect over the left frontal parietal skull. 3.  No acute facial bone fracture. 4.  No acute cervical spine injury. 5. Enlarged heterogeneous  thyroid likely goiter. Recommend thyroid ultrasound (ref: J Am Coll Radiol. 2015 Feb;12(2): 143-50). Critical Value/emergent results were called by telephone at the time of interpretation on 12/19/2019 at 11:08 am to provider Dr. Wyvonnia Dusky, who verbally acknowledged these results. Electronically Signed   By: Marin Olp M.D.   On: 12/19/2019 11:09   CT Cervical Spine Wo Contrast  Result Date: 12/19/2019 CLINICAL DATA:  Encephalopathy.  Trauma. EXAM: CT HEAD WITHOUT CONTRAST CT MAXILLOFACIAL WITHOUT CONTRAST CT CERVICAL SPINE WITHOUT CONTRAST TECHNIQUE: Multidetector CT imaging of the head, cervical spine, and maxillofacial structures were performed using the standard protocol without intravenous contrast. Multiplanar CT image reconstructions of the cervical spine and maxillofacial structures were also generated. COMPARISON:  None. FINDINGS: CT HEAD FINDINGS Brain: Examination demonstrates an acute left subdural hematoma most prominent over the temporal region rib there is a focal bulbous area measuring approximately 2 cm in thickness. This focal masslike area of acute subdural hemorrhage appears to communicate with a parenchymal component of acute hemorrhage over the posterior left temporal region measuring 2.2 x 3.1 cm. There is a component of acute subdural hemorrhage over the left side of the anterior interhemispheric fissure. This also a component of acute subarachnoid hemorrhage over the left tentorium. There is mild local mass effect over the left hemisphere with midline shift to the right of approximately 5-6 mm. Possible mild associated white matter edema over the left parietal/posterior temporal region. Subtle focal areas of acute subarachnoid hemorrhage over the left temporal region. Small lower density subdural fluid collection along the high left falx measuring 6-7 mm in thickness. Vascular: No hyperdense vessel or unexpected calcification. Skull: Normal. Negative for fracture or focal lesion. Oval  focal postsurgical defect over the left frontal parietal skull. Other: None. CT MAXILLOFACIAL FINDINGS Osseous: No evidence of facial bone fracture. Orbits: Negative. No traumatic or inflammatory finding. Sinuses: Paranasal sinuses are well developed and well aerated without air-fluid level. Subtle focal opacification over the right ethmoid air cells and small mucous retention cyst over the anterior floor the left maxillary sinus. Ostiomeatal complexes are patent. Visualized mastoid air cells are clear. Soft tissues: No focal soft tissue injury. CT CERVICAL SPINE FINDINGS Alignment: Normal. Skull base and vertebrae: Vertebral body heights are normal. Minimal spondylosis throughout the cervical spine. Atlantoaxial articulation is unremarkable. No evidence of acute fracture or traumatic subluxation. Soft tissues and spinal canal: No prevertebral fluid or swelling. No visible canal hematoma. Disc levels:  No significant disc space narrowing. Upper chest: Enlarged heterogeneous goiter. Other: None. IMPRESSION: 1. Moderate acute subdural hematoma over the left convexity most prominent over the temporal region with there is focal bulbous prominence of this hematoma measuring 2 cm in thickness. This subdural hematoma appears to communicate with an area of acute parenchymal hemorrhage over the left temporal lobe measuring 2.2 x 3.1 cm. There is minimal associated acute subarachnoid hemorrhage over the left temporal region. Small amount of acute subdural hemorrhage over the anterior interhemispheric fissure as well as left tentorium. Local mass effect over the left convexity and mild midline shift to the right of 5-6 mm. 2. 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 Postsurgical defect over the left frontal parietal skull. 3.  No acute facial bone fracture. 4.  No acute cervical  spine injury. 5. Enlarged heterogeneous thyroid likely goiter.  Recommend thyroid ultrasound (ref: J Am Coll Radiol. 2015 Feb;12(2): 143-50). Critical Value/emergent results were called by telephone at the time of interpretation on 12/19/2019 at 11:08 am to provider Dr. Barbie Bannererantolo, who verbally acknowledged these results. Electronically Signed   By: Elberta Fortisaniel  Boyle M.D.   On: 12/19/2019 11:09   CT Maxillofacial Wo Contrast  Result Date: 12/19/2019 CLINICAL DATA:  Encephalopathy.  Trauma. EXAM: CT HEAD WITHOUT CONTRAST CT MAXILLOFACIAL WITHOUT CONTRAST CT CERVICAL SPINE WITHOUT CONTRAST TECHNIQUE: Multidetector CT imaging of the head, cervical spine, and maxillofacial structures were performed using the standard protocol without intravenous contrast. Multiplanar CT image reconstructions of the cervical spine and maxillofacial structures were also generated. COMPARISON:  None. FINDINGS: CT HEAD FINDINGS Brain: Examination demonstrates an acute left subdural hematoma most prominent over the temporal region rib there is a focal bulbous area measuring approximately 2 cm in thickness. This focal masslike area of acute subdural hemorrhage appears to communicate with a parenchymal component of acute hemorrhage over the posterior left temporal region measuring 2.2 x 3.1 cm. There is a component of acute subdural hemorrhage over the left side of the anterior interhemispheric fissure. This also a component of acute subarachnoid hemorrhage over the left tentorium. There is mild local mass effect over the left hemisphere with midline shift to the right of approximately 5-6 mm. Possible mild associated white matter edema over the left parietal/posterior temporal region. Subtle focal areas of acute subarachnoid hemorrhage over the left temporal region. Small lower density subdural fluid collection along the high left falx measuring 6-7 mm in thickness. Vascular: No hyperdense vessel or unexpected calcification. Skull: Normal. Negative for fracture or focal lesion. Oval focal postsurgical  defect over the left frontal parietal skull. Other: None. CT MAXILLOFACIAL FINDINGS Osseous: No evidence of facial bone fracture. Orbits: Negative. No traumatic or inflammatory finding. Sinuses: Paranasal sinuses are well developed and well aerated without air-fluid level. Subtle focal opacification over the right ethmoid air cells and small mucous retention cyst over the anterior floor the left maxillary sinus. Ostiomeatal complexes are patent. Visualized mastoid air cells are clear. Soft tissues: No focal soft tissue injury. CT CERVICAL SPINE FINDINGS Alignment: Normal. Skull base and vertebrae: Vertebral body heights are normal. Minimal spondylosis throughout the cervical spine. Atlantoaxial articulation is unremarkable. No evidence of acute fracture or traumatic subluxation. Soft tissues and spinal canal: No prevertebral fluid or swelling. No visible canal hematoma. Disc levels:  No significant disc space narrowing. Upper chest: Enlarged heterogeneous goiter. Other: None. IMPRESSION: 1. Moderate acute subdural hematoma over the left convexity most prominent over the temporal region with there is focal bulbous prominence of this hematoma measuring 2 cm in thickness. This subdural hematoma appears to communicate with an area of acute parenchymal hemorrhage over the left temporal lobe measuring 2.2 x 3.1 cm. There is minimal associated acute subarachnoid hemorrhage over the left temporal region. Small amount of acute subdural hemorrhage over the anterior interhemispheric fissure as well as left tentorium. Local mass effect over the left convexity and mild midline shift to the right of 5-6 mm. 2. 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 Postsurgical defect over the left frontal parietal skull. 3.  No acute facial bone fracture. 4.  No acute cervical spine injury. 5. Enlarged heterogeneous thyroid likely goiter. Recommend thyroid  ultrasound (ref: J Am Coll Radiol. 2015 Feb;12(2): 143-50). Critical Value/emergent results were called by telephone at the time  of interpretation on 12/19/2019 at 11:08 am to provider Dr. Barbie Banner, who verbally acknowledged these results. Electronically Signed   By: Elberta Fortis M.D.   On: 12/19/2019 11:09    Assessment/Plan: 64 yo F with severe pulm HTN on warfarin with a left temporal lobe ICH with extension into subdural space. - neurologic status waxing and waning some, but would recommend nonoperative treatment for now.  In 2-4 weeks, she may be a candidate for bur hole drainage if her subdural hematoma is worsening.  or She has history of previous chronic subdural drainage via bur hole. - discussed the plan with the daughter.  As patient may need an extended recovery period, she was interested in transferring to a rehab near her home in [place] which would be okay once stable.   Bedelia Person 12/21/2019, 9:36 AM

## 2019-12-21 NOTE — Progress Notes (Signed)
Cassette changed tonight at 1945 with nursing, rapid response, and daughter at bedside - expiration date and time was verified prior to administration.  Sherron Monday, PharmD, BCCCP Clinical Pharmacist  Phone: [phone] 12/21/2019 7:51 PM  Please check AMION for all [facility] phone numbers After 10:00 PM, call Main Pharmacy [phone]

## 2019-12-21 NOTE — Progress Notes (Signed)
eLink Physician-Brief Progress Note Patient Name: LORIE CLECKLEY DOB: 01/12/1956 MRN: [id number]   Date of Service  12/21/2019  HPI/Events of Note  Notified of of Na 157 on 3% saline at 75 cc/hr. Goal Na 150-157. Na level 153 3 hours prior. Urine output adequate, not profusely diuresing.  eICU Interventions  Decrease 3% to 40 cc/hr and continue to monitor Na level closely     Intervention Category Major Interventions: Electrolyte abnormality - evaluation and management  Darl Pikes 12/21/2019, 4:36 AM

## 2019-12-21 NOTE — Progress Notes (Signed)
NAME:  Elizabeth Shepherd, MRN:  [id number], DOB:  1956-01-20, LOS: 2 ADMISSION DATE:  12/19/2019 REFERRING MD:  Renae Fickle Physician, CHIEF COMPLAINT: Altered mental status  Brief History   Patient was brought into the emergency room following being found knocking on people's doors at a hotel According to daughter, patient fell about 2 days ago, was slow to get up but was able to get up unable to move around Did not exhibit any weakness on any side, was slow but appropriate Patient was wandering around and knocking on people's doors was why she was brought into the hospital early this morning She had had a subdural bleed in the past about 3 years ago managed at [facility] Patient primarily resides in [place] Herself and her daughter were on a trip to go to [place] to see pts physician who was managing her pulmonary hypertension Treated for C. difficile in December 2020  On an infusion of Veletri  Past Medical History  History of subdural hemorrhage History of pulmonary hypertension Ataxia Chronic pain syndrome Chronic respiratory failure on home oxygen Reformed smoker Depression Hypercoagulable state-history of prothrombin mutation Atrial fibrillation Chronic metabolic acidosis  Significant Hospital Events   Required multiple doses of medications to sedate this morning as she was very agitated-ketamine, Geodon, benzodiazepines  Consults:  Neurosurgery Neurology  Procedures:  None   Significant Diagnostic Tests:  CT Head 12/20/19@11 :09  IMPRESSION: 1. Moderate acute subdural hematoma over the left convexity most prominent over the temporal region with there is focal bulbous prominence of this hematoma measuring 2 cm in thickness. This subdural hematoma appears to communicate with an area of acute parenchymal hemorrhage over the left temporal lobe measuring 2.2 x 3.1 cm. There is minimal associated acute subarachnoid hemorrhage over the left temporal region. Small amount of  acute subdural hemorrhage over the anterior interhemispheric fissure as well as left tentorium. Local mass effect over the left convexity and mild midline shift to the right of 5-6 mm.  2. 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 Postsurgical defect over the left frontal parietal skull.  3.  No acute facial bone fracture.  4.  No acute cervical spine injury.  5. Enlarged heterogeneous thyroid likely goiter. Recommend thyroid ultrasound (ref: J Am Coll Radiol. 2015 Feb;12(2): 143-50).  CT Head 12/19/19 @15 :29 IMPRESSION: 1. Stable extensive left side subdural hematoma, lobulated and ranging from 7 mm to 20 mm in thickness. 2. Stable left temporal lobe hemorrhagic contusion. Stable small volume left para falcine subdural blood. 3. Stable intracranial mass effect with rightward midline shift at 7 mm. 4. No new intracranial abnormality. 5. Mild bilateral mastoid effusions. No acute skull fracture Identified. CT head 4/12 > unchanged left temporal lobe contusion and left cerebral SDH.  Micro Data:  SARS coronavirus negative Influenza A and B negative  Antimicrobials:  None  Interim history/subjective:  No acute events. Repeat CT stable.  Objective   Blood pressure (!) 84/52, pulse (!) 58, temperature 98.1 F (36.7 C), temperature source Axillary, resp. rate 18, height 5\' 8"  (1.727 m), weight 77.1 kg, SpO2 97 %.        Intake/Output Summary (Last 24 hours) at 12/21/2019 0826 Last data filed at 12/21/2019 0600 Gross per 24 hour  Intake 2654.06 ml  Output 1650 ml  Net 1004.06 ml   Filed Weights   12/19/19 0422  Weight: 77.1 kg   Physical Exam: General: Elderly female laying in bed, no acute distress  HEENT: [place]/AT.  EOMI once stimulated (somnolent after receiving sedation for CT). Respiratory: Clear to auscultation bilaterally.  No crackles, wheezing or rales Cardiovascular: RRR, -M/R/G,  no JVD GI: BS+, soft, nontender Extremities:-Edema,-tenderness Neuro: Somnolent after receiving sedation for CT.   Assessment & Plan:   Altered mental status - UDS + benzos otherwise tox work-up negative. - Wean Precedex for goal 0. - Restart gabapentin at 1/2 home dose.  Acute left subdural hematoma in setting of anticoagulation - S/p Kcentral and Vitamin K. INR normalized.  Repeat CT head stable Possibly acute on chronic subdural hematoma - NSG and Neuro following. Not a surgical candidate at this time. - Continue keppra, valproic acid. - Continue hypertonic saline per Neuro and trend Na q6h. - Goal SBP <140. - Hold anticoagulation.  Coagulopathy - resolved. - Hold coumadin.  Chronic respiratory failure - On 2 to 3 L of oxygen. - Oxygen supplementation as needed.  Idiopathic pulmonary arterial hypertension - Managed by physician in [place]. - Continue Bosentan (Tracleer) 125 mg PO BID, Tadalafil (Alyq/Advirca) 40 mg daily at bedtime, Epoprostenol (Veletri) 60,000 ng/mL (100 mL) - running at 94 mL/day, lasix and spironlactone  Hypokalemia - Replete K and follow BMP.  Acute on Chronic kidney disease - resolved. - Avoid nephrotoxic agents. - Continue home sodium bicarbonate. - Trend UOP/Cr.  History of depression, ataxia, chronic pain. - Supportive care.  Risk with surgery and pts  severe pulmonary hypertension-recovery and rehabilitation will be very challenging  Best practice:  Diet: Dysphagia 2 Pain/Anxiety/Delirium protocol (if indicated): Precedex VAP protocol (if indicated): Not applicable DVT prophylaxis: SCD GI prophylaxis: PPI Glucose control: Will monitor Mobility: Bedrest Code Status: Full code Family Communication: Updated husband 4/11 Disposition: Neuro ICU   CC time: 35 min.   Montey Hora, [place] [facility] 12/21/2019, 8:41 AM

## 2019-12-21 NOTE — Social Work (Signed)
CSW was unable to complete sbirt due to patient being sleep and unable to work. CSW will attempt to follow at more appropriate time.   Jimmy Picket, Theresia Majors, [place] Clinical Social Worker [phone]

## 2019-12-21 NOTE — Progress Notes (Signed)
STROKE TEAM PROGRESS NOTE       INTERVAL HISTORY Her  RN is at the bedside.  Patient remains agitated requiring sedation on Precedex drip as well as bilateral restraints.  CT scan of the head this morning shows stable appearance of left temporal contusion as well as subdural hematoma and midline shift.  She is relatively hypotensive.  No obvious witnessed seizure activity.  She is on hypertonic saline at 40 cc an hour with serum sodium is 157  this morning.  The drip was turned off during rounds.  She remains aphasic confused but seems to do better when the daughter is visiting.   OBJECTIVE Vitals:   12/21/19 1000 12/21/19 1100 12/21/19 1200 12/21/19 1300  BP: (!) 80/50 (!) 78/50 (!) 75/47 (!) 90/56  Pulse: 66 66 77 84  Resp: 17 18 (!) 25 (!) 22  Temp:   (!) 97.1 F (36.2 C)   TempSrc:   Oral   SpO2: 99% 98% 97% 100%  Weight:      Height:        CBC:  Recent Labs  Lab 12/19/19 0625 12/19/19 0635 12/20/19 0528 12/21/19 0248  WBC 5.0   < > 3.8* 4.4  NEUTROABS 3.3  --   --   --   HGB 12.6   < > 9.1* 9.0*  HCT 40.8   < > 28.6* 28.7*  MCV 77.3*   < > 76.9* 79.1*  PLT 223   < > 183 183   < > = values in this interval not displayed.    Basic Metabolic Panel:  Recent Labs  Lab 12/20/19 0528 12/20/19 1130 12/21/19 0248 12/21/19 1111  NA 147*   < > 157* 159*  K 3.3*  --  3.4*  --   CL 126*  --  >130*  --   CO2 14*  --  16*  --   GLUCOSE 178*  --  141*  --   BUN 18  --  16  --   CREATININE 0.93  --  0.95  --   CALCIUM 9.3  --  9.8  --   MG  --   --  1.8  --    < > = values in this interval not displayed.    Lipid Panel: No results found for: CHOL, TRIG, HDL, CHOLHDL, VLDL, LDLCALC HgbA1c: No results found for: HGBA1C Urine Drug Screen:     Component Value Date/Time   LABOPIA NONE DETECTED 12/19/2019 0734   COCAINSCRNUR NONE DETECTED 12/19/2019 0734   LABBENZ POSITIVE (A) 12/19/2019 0734   AMPHETMU NONE DETECTED 12/19/2019 0734   THCU NONE DETECTED 12/19/2019  0734   LABBARB NONE DETECTED 12/19/2019 0734    Alcohol Level     Component Value Date/Time   ETH <10 12/19/2019 0625    IMAGING  CT HEAD WO CONTRAST 12/19/2019 IMPRESSION:  1. Stable extensive left side subdural hematoma, lobulated and ranging from 7 mm to 20 mm in thickness.  2. Stable left temporal lobe hemorrhagic contusion. Stable small volume left para falcine subdural blood.  3. Stable intracranial mass effect with rightward midline shift at 7 mm.  4. No new intracranial abnormality.  5. Mild bilateral mastoid effusions. No acute skull fracture identified.    CT Head Wo Contrast CT Maxillofacial Wo Contrast 12/19/2019 IMPRESSION:  1. Moderate acute subdural hematoma over the left convexity most prominent over the temporal region with there is focal bulbous prominence of this hematoma measuring 2 cm in thickness. This subdural  hematoma appears to communicate with an area of acute parenchymal hemorrhage over the left temporal lobe measuring 2.2 x 3.1 cm. There is minimal associated acute subarachnoid hemorrhage over the left temporal region. Small amount of acute subdural hemorrhage over the anterior interhemispheric fissure as well as left tentorium. Local mass effect over the left convexity and mild midline shift to the right of 5-6 mm.  2. 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 Postsurgical defect over the left frontal parietal skull.  3.  No acute facial bone fracture.  4.  No acute cervical spine injury.  5. Enlarged heterogeneous thyroid likely goiter. Recommend thyroid ultrasound (ref: J Am Coll Radiol. 2015 Feb;12(2): 143-50).   CT Head Wo Contrast - Unchanged left temporal lobe contusion and left cerebral convexity subdural hemorrhage.  12/21/19   CT Cervical Spine Wo Contrast 12/19/2019 IMPRESSION:  No acute cervical spine injury.   DG Chest Portable 1 View 12/19/2019 IMPRESSION:   1. 19 mm nodular density overlying the right infrahilar region, suspected to be vascular in nature related to the underlying right pulmonary arteries. A possible underlying nodule/mass could also have this appearance. Further assessment with dedicated cross-sectional imaging of the chest suggested for further evaluation.  2. Mild cardiomegaly. No other active cardiopulmonary disease.   DG Shoulder Left Portable 12/19/2019 IMPRESSION:  Negative.  DG Shoulder Right Portable 12/19/2019 IMPRESSION:  No acute osseous abnormality.    ECG - ST rate 101 BPM. (See cardiology reading for complete details)   PHYSICAL EXAM Blood pressure (!) 90/56, pulse 84, temperature (!) 97.1 F (36.2 C), temperature source Oral, resp. rate (!) 22, height 5\' 8"  (1.727 m), weight 77.1 kg, SpO2 100 %. Pleasant middle-age African-American lady not in distress. . Afebrile. Head is nontraumatic. Neck is supple without bruit.    Cardiac exam no murmur or gallop. Lungs are clear to auscultation. Distal pulses are well felt.  Neurological Exam :  Patient is drowsy and sedated.  She barely groans and moans and mumbles nonsensically to sternal rub and auditory stimuli..  She appears globally aphasic with nonfluent speech but able to answers simple questions and speaks a few words and short sentences.  She has trouble with naming and repetition.  Follows only occasional simple one-step commands.  She is disoriented to place and person.  Extraocular movements are full range without nystagmus.  She blinks to threat more on the left than the right.  Right lower facial weakness.  Tongue midline.  Motor system exam she is able to move all 4 extremities equally well against gravity but will not cooperate if for detailed muscle testing.  Sensation appears preserved bilaterally.  Plantars are downgoing.  Gait not tested  ASSESSMENT/PLAN Elizabeth Shepherd is a 64 y.o. 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  She did not receive IV t-PA due to ICH.  Extensive left side subdural hematoma  Resultant altered mental status and aphasia  Code Stroke CT Head - not ordered  CT head - 12/19/19 - Stable extensive left side subdural hematoma, lobulated and ranging from 7 mm to 20 mm in thickness. Stable left temporal lobe hemorrhagic contusion. Stable small volume left para falcine subdural blood.  Stable intracranial mass effect with rightward midline shift at 7 mm.   CT Head - 12/19/19 - Moderate acute subdural hematoma over the left convexity most prominent over the temporal region with there is focal bulbous prominence of this hematoma measuring 2 cm in thickness. This subdural hematoma appears to communicate with an area of acute parenchymal hemorrhage over the left temporal lobe measuring 2.2 x 3.1 cm. There is minimal associated acute subarachnoid hemorrhage over the left temporal region. Small amount of acute subdural hemorrhage over the anterior interhemispheric fissure as well as left tentorium. Local mass effect over the left convexity and mild midline shift to the right of 5-6 mm. 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 Postsurgical defect over the left frontal parietal skull. CT Head - 12/21/19 - Unchanged left temporal lobe contusion and left cerebral convexity subdural hemorrhage.    MRI head - not ordered  MRA head - not ordered  CTA H&N - not ordered  CT Perfusion - not ordered  Carotid Doppler - not ordered  2D Echo - not ordered  [employer] Virus 2 - negative  LDL - not ordered  HgbA1c - not ordered  UDS - benzodiazepine (home meds  include ativan)  VTE prophylaxis - SCDs Diet  Diet Order            DIET DYS 2 Room service appropriate? No; Fluid consistency: Thin  Diet effective now              warfarin daily prior to admission, now on No antithrombotic  Ongoing aggressive stroke risk factor management  Therapy recommendations:  pending  Disposition:  Pending  Hypertension  Home BP meds: none (aldactone)  Current BP meds: none (diuretics)  Pt is mildly hypotensive . SBP goal < 140 mm Hg initially . Long-term BP goal normotensive  Other Stroke Risk Factors  Advanced age  Former cigarette smoker - quit  ETOH use, advised to drink no more than 1 alcoholic beverage per day.   Atrial fibrillation  Substance Abuse  Other Active Problems  Code status - Full code  Coumadin PTA for venous thromboembolism (INR 5.6 on admission) - reversed with Kcentra and Vitamin K ->INR1.4  CT Head - Enlarged heterogeneous thyroid likely goiter. Recommend thyroid ultrasound  CKD stage 3b - Creatinine - 1.56->0.93  Elevated ammonia - 39  Abnormal CXR - 19 mm nodular density overlying the right infrahilar region, suspected to be vascular in nature related to the underlying right pulmonary arteries. A possible underlying nodule/mass could also have this appearance. Further assessment with dedicated cross-sectional imaging of the chest suggested for further evaluation.   Anemia - Hb - 12.6->12.2->9.1  Hypokalemia - 3.7->3.3  Mild bradycardia - 50's   Hospital day # 2 Patient presented with altered mental status secondary to large acute on chronic left subdural hematoma along with hemorrhagic contusion component as well following a fall to 3 days ago.  She has remote history of left-sided subdural hematoma in 2016 s/p surgery while in [place].  Being on warfarin would have also contributed to this.  INR has been reversed with Kcentra and is stable at 1.4 this morning.  Agree with neurosurgery she needs  conservative medical treatment rather than surgery which has extensive risk of morbidity given the size of her hemorrhage .  Hold hypertonic saline drip for now seen serum sodium is above goal off 150-1 55.  Continue valproic acid 500 mg   every  8 hourly for agitation as well as seizure prevention.  Taper Precedex drip.  Long discussion with patient and RN as well as her daughter whom I spoke to yesterday during p.m. rounds and answered questions.  No family available at the bedside for discussion during a.m. rounds today This patient is critically ill and at significant risk of neurological worsening, death and care requires constant monitoring of vital signs, hemodynamics,respiratory and cardiac monitoring, extensive review of multiple databases, frequent neurological assessment, discussion with family, other specialists and medical decision making of high complexity.I have made any additions or clarifications directly to the above note.This critical care time does not reflect procedure time, or teaching time or supervisory time of PA/NP/Med Resident etc but could involve care discussion time.  I spent 30 minutes of neurocritical care time  in the care of  this patient. Delia Heady, MD  To contact Stroke Continuity provider, please refer to [email or web address]. After hours, contact General Neurology

## 2019-12-21 NOTE — Progress Notes (Signed)
Patient transported to and from CT without incident.

## 2019-12-22 ENCOUNTER — Inpatient Hospital Stay (HOSPITAL_COMMUNITY): Payer: Medicare HMO

## 2019-12-22 DIAGNOSIS — I509 Heart failure, unspecified: Secondary | ICD-10-CM

## 2019-12-22 LAB — PHOSPHORUS: Phosphorus: 3.2 mg/dL (ref 2.5–4.6)

## 2019-12-22 LAB — CBC
HCT: 35.3 % — ABNORMAL LOW (ref 36.0–46.0)
Hemoglobin: 11 g/dL — ABNORMAL LOW (ref 12.0–15.0)
MCH: 24.6 pg — ABNORMAL LOW (ref 26.0–34.0)
MCHC: 31.2 g/dL (ref 30.0–36.0)
MCV: 79 fL — ABNORMAL LOW (ref 80.0–100.0)
Platelets: 269 10*3/uL (ref 150–400)
RBC: 4.47 MIL/uL (ref 3.87–5.11)
RDW: 19.4 % — ABNORMAL HIGH (ref 11.5–15.5)
WBC: 7.4 10*3/uL (ref 4.0–10.5)
nRBC: 0 % (ref 0.0–0.2)

## 2019-12-22 LAB — BASIC METABOLIC PANEL
Anion gap: 12 (ref 5–15)
Anion gap: 10 (ref 5–15)
BUN: 18 mg/dL (ref 8–23)
BUN: 16 mg/dL (ref 8–23)
CO2: 16 mmol/L — ABNORMAL LOW (ref 22–32)
CO2: 19 mmol/L — ABNORMAL LOW (ref 22–32)
Calcium: 10.4 mg/dL — ABNORMAL HIGH (ref 8.9–10.3)
Calcium: 10.3 mg/dL (ref 8.9–10.3)
Chloride: 128 mmol/L — ABNORMAL HIGH (ref 98–111)
Chloride: 120 mmol/L — ABNORMAL HIGH (ref 98–111)
Creatinine, Ser: 1.37 mg/dL — ABNORMAL HIGH (ref 0.44–1.00)
Creatinine, Ser: 1.35 mg/dL — ABNORMAL HIGH (ref 0.44–1.00)
GFR calc Af Amer: 48 mL/min — ABNORMAL LOW (ref >60)
GFR calc Af Amer: 49 mL/min — ABNORMAL LOW (ref >60)
GFR calc non Af Amer: 42 mL/min — ABNORMAL LOW (ref >60)
GFR calc non Af Amer: 42 mL/min — ABNORMAL LOW (ref >60)
Glucose, Bld: 129 mg/dL — ABNORMAL HIGH (ref 70–99)
Glucose, Bld: 115 mg/dL — ABNORMAL HIGH (ref 70–99)
Potassium: 2.9 mmol/L — ABNORMAL LOW (ref 3.5–5.1)
Potassium: 3.3 mmol/L — ABNORMAL LOW (ref 3.5–5.1)
Sodium: 156 mmol/L — ABNORMAL HIGH (ref 135–145)
Sodium: 149 mmol/L — ABNORMAL HIGH (ref 135–145)

## 2019-12-22 LAB — PROTIME-INR
INR: 1.5 — ABNORMAL HIGH (ref 0.8–1.2)
Prothrombin Time: 17.9 seconds — ABNORMAL HIGH (ref 11.4–15.2)

## 2019-12-22 LAB — MAGNESIUM: Magnesium: 1.8 mg/dL (ref 1.7–2.4)

## 2019-12-22 LAB — ECHOCARDIOGRAM COMPLETE
Height: 68 in
Weight: 2723.12 oz

## 2019-12-22 LAB — SODIUM: Sodium: 154 mmol/L — ABNORMAL HIGH (ref 135–145)

## 2019-12-22 MED ORDER — POTASSIUM CHLORIDE CRYS ER 20 MEQ PO TBCR
40.0000 meq | EXTENDED_RELEASE_TABLET | Freq: Once | ORAL | Status: AC
  Administered 2019-12-22: 40 meq via ORAL
  Filled 2019-12-22: qty 2

## 2019-12-22 MED ORDER — MAGNESIUM SULFATE 2 GM/50ML IV SOLN
2.0000 g | Freq: Once | INTRAVENOUS | Status: AC
  Administered 2019-12-22: 2 g via INTRAVENOUS
  Filled 2019-12-22: qty 50

## 2019-12-22 MED ORDER — ACETAMINOPHEN 325 MG PO TABS
650.0000 mg | ORAL_TABLET | Freq: Three times a day (TID) | ORAL | Status: DC | PRN
  Administered 2019-12-22: 650 mg via ORAL
  Filled 2019-12-22: qty 2

## 2019-12-22 MED ORDER — SPIRONOLACTONE 25 MG PO TABS
50.0000 mg | ORAL_TABLET | Freq: Two times a day (BID) | ORAL | Status: DC
  Administered 2019-12-22 – 2019-12-25 (×7): 50 mg via ORAL
  Filled 2019-12-22 (×9): qty 2

## 2019-12-22 NOTE — Progress Notes (Signed)
Cassette changed tonight at 1939 with nursing, rapid response, and daughter at bedside - expiration date and time was verified prior to administration. Issue with pharmacy provided tubing (high flow turbulence) - primed with patients tubing. Daughter, Erie Noe to bring in tubing tomorrow to exchange tubing at next dose change.   Link Snuffer, PharmD, BCPS, BCCCP Clinical Pharmacist Phone: [phone] 12/22/2019 7:49 PM  Please check AMION for all [facility] Pharmacy phone numbers After 10:00 PM, call Main Pharmacy [phone]

## 2019-12-22 NOTE — Progress Notes (Signed)
Subjective: Patient reports no headache  Objective: Vital signs in last 24 hours: Temp:  [97.1 F (36.2 C)-97.8 F (36.6 C)] 97.7 F (36.5 C) (04/13 0400) Pulse Rate:  [30-110] 94 (04/13 1100) Resp:  [13-25] 17 (04/13 1100) BP: (75-135)/(47-70) 101/62 (04/13 1100) SpO2:  [84 %-100 %] 100 % (04/13 1100) Weight:  [77.2 kg] 77.2 kg (04/13 0500)  Intake/Output from previous day: 04/12 0701 - 04/13 0700 In: 449 [I.V.:249; IV Piggyback:200] Out: 1000 [Urine:1000] Intake/Output this shift: Total I/O In: 446.8 [P.O.:360; IV Piggyback:86.8] Out: -   Sleepy, eyes half open but conversant.  Oriented to person, hospital.  Dysarthric. Difficulty following complex commands but follows simple commands x 4.  Right pronator drift.  Lab Results: Recent Labs    12/21/19 0248 12/22/19 0620  WBC 4.4 7.4  HGB 9.0* 11.0*  HCT 28.7* 35.3*  PLT 183 269   BMET Recent Labs    12/21/19 0248 12/21/19 1111 12/22/19 0620 12/22/19 1114  NA 157*   < > 156* 154*  K 3.4*  --  2.9*  --   CL >130*  --  128*  --   CO2 16*  --  16*  --   GLUCOSE 141*  --  129*  --   BUN 16  --  18  --   CREATININE 0.95  --  1.37*  --   CALCIUM 9.8  --  10.4*  --    < > = values in this interval not displayed.    Studies/Results: CT Head Wo Contrast  Result Date: 12/21/2019 CLINICAL DATA:  Follow-up subdural hematoma EXAM: CT HEAD WITHOUT CONTRAST TECHNIQUE: Contiguous axial images were obtained from the base of the skull through the vertex without intravenous contrast. COMPARISON:  Two days ago FINDINGS: Brain: Parenchymal hemorrhage with surrounding edema in the left posterior temporal lobe, unchanged at 15 x 23 mm on coronal reformats. There is subdural hematoma along the left cerebral convexity greatest laterally but also seen along the tentorium and interhemispheric fissure. At the level of the contusion the subdural hemorrhage measures up to 2 cm in thickness. Midline shift measures 4 mm. No entrapment or  infarct. Vascular: Negative Skull: No acute finding.  Remote left frontal burr hole. Sinuses/Orbits: Negative IMPRESSION: Unchanged left temporal lobe contusion and left cerebral convexity subdural hemorrhage. Electronically Signed   By: Marnee Spring M.D.   On: 12/21/2019 06:33    Assessment/Plan: 64 yo F with severe pulm HTN, ?prothrombotic disorder on warfarin and hx of SDH bur hole drainage who developed left temporal lobe ICH and SDH. - continue with supportive care for now, hold coumadin for now.   Bedelia Person 12/22/2019, 11:54 AM

## 2019-12-22 NOTE — Progress Notes (Signed)
PCCM Interval Note  I have spoken with the transfer center at [facility] regarding the patient's case. Her family and her [facility] Attending in [place] Dr Sherrie Mustache have proposed transfer to [facility]. I agree that this would probably benefit Ms Bagent given her longstanding relationship with [facility], should help both acutely and longitudinally as long range management decisions need to be made about her anti-coagulation, PAH meds, etc.   Reviewed the case briefly with NSGY and in more detail with the MICU Attending. They will discuss, include the patient's physician Dr Sherrie Mustache, and get back to me about a possible transfer.    Levy Pupa, MD, PhD 12/22/2019, 4:51 PM [facility] [phone] or if no answer [phone]

## 2019-12-22 NOTE — Progress Notes (Signed)
  Echocardiogram 2D Echocardiogram has been performed.  Elizabeth Shepherd 12/22/2019, 11:45 AM

## 2019-12-22 NOTE — Progress Notes (Signed)
NAME:  Elizabeth Shepherd, MRN:  [id number], DOB:  1956-03-05, LOS: 3 ADMISSION DATE:  12/19/2019 REFERRING MD:  Renae Fickle Physician, CHIEF COMPLAINT: Altered mental status  Brief History   Patient was brought into the emergency room following being found knocking on people's doors at a hotel According to daughter, patient fell about 2 days ago, was slow to get up but was able to get up unable to move around Did not exhibit any weakness on any side, was slow but appropriate Patient was wandering around and knocking on people's doors was why she was brought into the hospital early this morning She had had a subdural bleed in the past about 3 years ago managed at [facility] Patient primarily resides in [place] Herself and her daughter were on a trip to go to [place] to see pts physician who was managing her pulmonary hypertension Treated for C. difficile in December 2020  On an infusion of Veletri  Past Medical History  History of subdural hemorrhage History of pulmonary hypertension Ataxia Chronic pain syndrome Chronic respiratory failure on home oxygen Reformed smoker Depression Hypercoagulable state-history of prothrombin mutation Atrial fibrillation Chronic metabolic acidosis  Significant Hospital Events   Required multiple doses of medications to sedate this morning as she was very agitated-ketamine, Geodon, benzodiazepines  Consults:  Neurosurgery Neurology  Procedures:  None   Significant Diagnostic Tests:  CT Head 12/20/19@11 :09  IMPRESSION: 1. Moderate acute subdural hematoma over the left convexity most prominent over the temporal region with there is focal bulbous prominence of this hematoma measuring 2 cm in thickness. This subdural hematoma appears to communicate with an area of acute parenchymal hemorrhage over the left temporal lobe measuring 2.2 x 3.1 cm. There is minimal associated acute subarachnoid hemorrhage over the left temporal region. Small amount of  acute subdural hemorrhage over the anterior interhemispheric fissure as well as left tentorium. Local mass effect over the left convexity and mild midline shift to the right of 5-6 mm.  2. 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 Postsurgical defect over the left frontal parietal skull.  3.  No acute facial bone fracture.  4.  No acute cervical spine injury.  5. Enlarged heterogeneous thyroid likely goiter. Recommend thyroid ultrasound (ref: J Am Coll Radiol. 2015 Feb;12(2): 143-50).  CT Head 12/19/19 @15 :29 IMPRESSION: 1. Stable extensive left side subdural hematoma, lobulated and ranging from 7 mm to 20 mm in thickness. 2. Stable left temporal lobe hemorrhagic contusion. Stable small volume left para falcine subdural blood. 3. Stable intracranial mass effect with rightward midline shift at 7 mm. 4. No new intracranial abnormality. 5. Mild bilateral mastoid effusions. No acute skull fracture Identified. CT head 4/12 > unchanged left temporal lobe contusion and left cerebral SDH.  Micro Data:  SARS coronavirus negative Influenza A and B negative  Antimicrobials:  None  Interim history/subjective:  Required Precedex afternoon 4/12, turned off yesterday evening. Sodium 156, hypokalemia and hypomagnesemia noted Acute rise serum creatinine to 1.37  Objective   Blood pressure 99/70, pulse (!) 101, temperature 97.7 F (36.5 C), temperature source Axillary, resp. rate 19, height 5\' 8"  (1.727 m), weight 77.2 kg, SpO2 98 %.        Intake/Output Summary (Last 24 hours) at 12/22/2019 0813 Last data filed at 12/22/2019 0700 Gross per 24 hour  Intake 329.92 ml  Output 1000 ml  Net -670.08 ml   Filed Weights   12/19/19 0422 12/22/19 0500  Weight:  77.1 kg 77.2 kg   Physical Exam: General: Elderly woman, chronically ill, no distress HEENT: Oropharynx clear.  She has facial erythema, improved  compared with 4/12 Respiratory: Coarse, no wheeze Cardiovascular: No murmur GI: B nondistended, positive bowel sounds Extremities: No edema Neuro: Awake, somewhat dysarthric but understandable.  Answers questions, follows commands.   Assessment & Plan:   Altered mental status - UDS + benzos otherwise tox work-up negative..  Presumed due to acute SDH Acute left subdural hematoma in setting of anticoagulation, fall- S/p Kcentral and Vitamin K. INR normalized.  Repeat CT head stable 4/12 Possibly acute on chronic subdural hematoma Appreciate neurology and neurosurgery evaluation, surgical treatment not recommended at this point.  Following for clinical improvement Continue Keppra, valproic acid per neurology recommendations Hypertonic saline held, sodium remains at goal 156 SBP goal less than 140 Anticoagulation on hold, unclear whether she will be a candidate to have this restarted.  Will have to the risk benefits given her factor II mutation, A. fib, pulmonary hypertension.  Coagulopathy due to medication- resolved. Anticoagulation on hold as above.  Will need to determine timing of possible restart when she is stable from this event.  Chronic respiratory failure - On 2 to 3 L of oxygen. Titrate for SPO2 greater than 90%  Idiopathic pulmonary arterial hypertension -has been managed in [place], plan was in place to transition management to physician in [place]. Continue bosentan twice daily, tadalafil nightly, epoprostenol infusion currently at 94 cc/day (60 K ng/cc) Remains on her standard diuretics.  With increase in her serum creatinine 4/13 plan to hold Lasix for at least 1 day, decrease per lactone to 50 mg twice daily and reassess 4/13 Check TTE to compare with priors  Hypokalemia, hypomagnesemia Replace 4/13, follow BMP, urine output  Acute on Chronic kidney disease, progression on 4/13 Avoiding nephrotoxic agents Follow BMP and urine output Hold Lasix for 1 day, decrease  prolactin for 1 day and follow  History of depression, ataxia, chronic pain. Supportive care Home BuSpar, Restoril    Best practice:  Diet: Dysphagia 2 Pain/Anxiety/Delirium protocol (if indicated): Precedex VAP protocol (if indicated): Not applicable DVT prophylaxis: SCD GI prophylaxis: PPI Glucose control: Will monitor Mobility: Bedrest Code Status: Full code Family Communication:  Disposition: Neuro ICU  Independent CC time 32 minutes  Baltazar Apo, MD, PhD 12/22/2019, 8:21 AM [facility] [phone] or if no answer [phone]

## 2019-12-22 NOTE — Progress Notes (Signed)
PT Cancellation Note  Patient Details Name: ROSELINDA BAHENA MRN: [id number] DOB: 04-04-1956   Cancelled Treatment:    Reason Eval/Treat Not Completed: Patient at procedure or test/unavailable. Pt undergoing IV placement and then will have echo done. PT to return as able to complete PT eval.  Lewis Shock, PT, DPT Acute Rehabilitation Services Pager #: [phone] Office #: [phone]    Iona Hansen 12/22/2019, 10:34 AM

## 2019-12-22 NOTE — Progress Notes (Signed)
STROKE TEAM PROGRESS NOTE       INTERVAL HISTORY Her  RN is at the bedside.  Patient appears less agitated this a.m. but she remains aphasic confused she is off hypertonic saline drip now for 24 hours and serum sodium is 157 this morning.  OBJECTIVE Vitals:   12/22/19 1000 12/22/19 1100 12/22/19 1200 12/22/19 1300  BP: (!) 91/53 101/62 (!) 118/59 99/67  Pulse:  94 98 97  Resp: 19 17 (!) 21 (!) 21  Temp:      TempSrc:      SpO2:  100% 100% 97%  Weight:      Height:        CBC:  Recent Labs  Lab 12/19/19 0625 12/19/19 0635 12/21/19 0248 12/22/19 0620  WBC 5.0   < > 4.4 7.4  NEUTROABS 3.3  --   --   --   HGB 12.6   < > 9.0* 11.0*  HCT 40.8   < > 28.7* 35.3*  MCV 77.3*   < > 79.1* 79.0*  PLT 223   < > 183 269   < > = values in this interval not displayed.    Basic Metabolic Panel:  Recent Labs  Lab 12/21/19 0248 12/21/19 1111 12/22/19 0620 12/22/19 1114  NA 157*   < > 156* 154*  K 3.4*  --  2.9*  --   CL >130*  --  128*  --   CO2 16*  --  16*  --   GLUCOSE 141*  --  129*  --   BUN 16  --  18  --   CREATININE 0.95  --  1.37*  --   CALCIUM 9.8  --  10.4*  --   MG 1.8  --  1.8  --   PHOS  --   --  3.2  --    < > = values in this interval not displayed.    Lipid Panel: No results found for: CHOL, TRIG, HDL, CHOLHDL, VLDL, LDLCALC HgbA1c: No results found for: HGBA1C Urine Drug Screen:     Component Value Date/Time   LABOPIA NONE DETECTED 12/19/2019 0734   COCAINSCRNUR NONE DETECTED 12/19/2019 0734   LABBENZ POSITIVE (A) 12/19/2019 0734   AMPHETMU NONE DETECTED 12/19/2019 0734   THCU NONE DETECTED 12/19/2019 0734   LABBARB NONE DETECTED 12/19/2019 0734    Alcohol Level     Component Value Date/Time   ETH <10 12/19/2019 0625    IMAGING  CT HEAD WO CONTRAST 12/19/2019 IMPRESSION:  1. Stable extensive left side subdural hematoma, lobulated and ranging from 7 mm to 20 mm in thickness.  2. Stable left temporal lobe hemorrhagic contusion. Stable small  volume left para falcine subdural blood.  3. Stable intracranial mass effect with rightward midline shift at 7 mm.  4. No new intracranial abnormality.  5. Mild bilateral mastoid effusions. No acute skull fracture identified.    CT Head Wo Contrast CT Maxillofacial Wo Contrast 12/19/2019 IMPRESSION:  1. Moderate acute subdural hematoma over the left convexity most prominent over the temporal region with there is focal bulbous prominence of this hematoma measuring 2 cm in thickness. This subdural hematoma appears to communicate with an area of acute parenchymal hemorrhage over the left temporal lobe measuring 2.2 x 3.1 cm. There is minimal associated acute subarachnoid hemorrhage over the left temporal region. Small amount of acute subdural hemorrhage over the anterior interhemispheric fissure as well as left tentorium. Local mass effect over the left convexity and mild  midline shift to the right of 5-6 mm.  2. 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 Postsurgical defect over the left frontal parietal skull.  3.  No acute facial bone fracture.  4.  No acute cervical spine injury.  5. Enlarged heterogeneous thyroid likely goiter. Recommend thyroid ultrasound (ref: J Am Coll Radiol. 2015 Feb;12(2): 143-50).   CT Head Wo Contrast - Unchanged left temporal lobe contusion and left cerebral convexity subdural hemorrhage.  12/21/19   CT Cervical Spine Wo Contrast 12/19/2019 IMPRESSION:  No acute cervical spine injury.   DG Chest Portable 1 View 12/19/2019 IMPRESSION:  1. 19 mm nodular density overlying the right infrahilar region, suspected to be vascular in nature related to the underlying right pulmonary arteries. A possible underlying nodule/mass could also have this appearance. Further assessment with dedicated cross-sectional imaging of the chest suggested for further evaluation.  2. Mild cardiomegaly. No other  active cardiopulmonary disease.   DG Shoulder Left Portable 12/19/2019 IMPRESSION:  Negative.  DG Shoulder Right Portable 12/19/2019 IMPRESSION:  No acute osseous abnormality.    ECG - ST rate 101 BPM. (See cardiology reading for complete details)   PHYSICAL EXAM Blood pressure 99/67, pulse 97, temperature 97.7 F (36.5 C), temperature source Axillary, resp. rate (!) 21, height 5\' 8"  (1.727 m), weight 77.2 kg, SpO2 97 %. Pleasant middle-age African-American lady not in distress. . Afebrile. Head is nontraumatic. Neck is supple without bruit.    Cardiac exam no murmur or gallop. Lungs are clear to auscultation. Distal pulses are well felt.  Neurological Exam :  Patient is awake and interactive.  She appears globally aphasic with nonfluent speech but able to answers simple questions and speaks a few words and short sentences.  She has trouble with naming and repetition.  Follows only occasional simple one-step commands.  She is disoriented to place and person.  Extraocular movements are full range without nystagmus.  She blinks to threat more on the left than the right.  Right lower facial weakness.  Tongue midline.  Motor system exam she is able to move all 4 extremities equally well against gravity but will not cooperate if for detailed muscle testing.  Sensation appears preserved bilaterally.  Plantars are downgoing.  Gait not tested  ASSESSMENT/PLAN Elizabeth Shepherd is a 64 y.o. 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  She did not receive IV t-PA due to ICH.  Extensive left side subdural hematoma  Resultant altered mental status and aphasia  Code Stroke CT Head - not ordered  CT head -  12/19/19 - Stable extensive left side subdural hematoma, lobulated and ranging from 7 mm to 20 mm in thickness. Stable left temporal lobe hemorrhagic contusion. Stable small volume left para falcine subdural blood. Stable intracranial mass effect with rightward midline shift at 7 mm.   CT Head - 12/19/19 - Moderate acute subdural hematoma over the left convexity most prominent over the temporal region with there is focal bulbous prominence of this hematoma measuring 2 cm in thickness. This subdural hematoma appears to communicate with an area of acute parenchymal hemorrhage over the left temporal lobe measuring 2.2 x 3.1 cm. There is minimal associated acute subarachnoid  hemorrhage over the left temporal region. Small amount of acute subdural hemorrhage over the anterior interhemispheric fissure as well as left tentorium. Local mass effect over the left convexity and mild midline shift to the right of 5-6 mm. 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 Postsurgical defect over the left frontal parietal skull. CT Head - 12/21/19 - Unchanged left temporal lobe contusion and left cerebral convexity subdural hemorrhage.    MRI head - not ordered  MRA head - not ordered  CTA H&N - not ordered  CT Perfusion - not ordered  Carotid Doppler - not ordered  2D Echo - not ordered  [employer] Virus 2 - negative  LDL - not ordered  HgbA1c - not ordered  UDS - benzodiazepine (home meds include ativan)  VTE prophylaxis - SCDs Diet  Diet Order            DIET DYS 2 Room service appropriate? No; Fluid consistency: Thin  Diet effective now              warfarin daily prior to admission, now on No antithrombotic  Ongoing aggressive stroke risk factor management  Therapy recommendations:  pending  Disposition:  Pending  Hypertension  Home BP meds: none (aldactone)  Current BP meds: none (diuretics)  Pt is  mildly hypotensive . SBP goal < 140 mm Hg initially . Long-term BP goal normotensive  Other Stroke Risk Factors  Advanced age  Former cigarette smoker - quit  ETOH use, advised to drink no more than 1 alcoholic beverage per day.   Atrial fibrillation  Substance Abuse  Other Active Problems  Code status - Full code  Coumadin PTA for venous thromboembolism (INR 5.6 on admission) - reversed with Kcentra and Vitamin K ->INR1.4  CT Head - Enlarged heterogeneous thyroid likely goiter. Recommend thyroid ultrasound  CKD stage 3b - Creatinine - 1.56->0.93  Elevated ammonia - 39  Abnormal CXR - 19 mm nodular density overlying the right infrahilar region, suspected to be vascular in nature related to the underlying right pulmonary arteries. A possible underlying nodule/mass could also have this appearance. Further assessment with dedicated cross-sectional imaging of the chest suggested for further evaluation.   Anemia - Hb - 12.6->12.2->9.1  Hypokalemia - 3.7->3.3  Mild bradycardia - 50's   Hospital day # 3 Continue conservative treatment.  Strict blood pressure control.  Keep hypertonic saline drip on hold and serum sodium goal is 150-155.  Continue valproic acid 500 mg   every 8 hourly for agitation as well as seizure prevention. p.  Long discussion with patient and RN  and answered questions.  No family available at the bedside for discussion during a.m. rounds today This patient is critically ill and at significant risk of neurological worsening, death and care requires constant monitoring of vital signs, hemodynamics,respiratory and cardiac monitoring, extensive review of multiple databases, frequent neurological assessment, discussion with family, other specialists and medical decision making of high complexity.I have made any additions or clarifications directly to the above note.This critical care time does not reflect procedure time, or teaching time or supervisory time of  PA/NP/Med Resident etc but could involve care discussion time.  I spent 30 minutes of neurocritical care time  in the care of  this patient. Antony Contras, MD  To contact Stroke Continuity provider, please refer to [email or web address] After hours, contact General Neurology

## 2019-12-22 NOTE — Progress Notes (Signed)
NAME:  Elizabeth Shepherd, MRN:  [id number], DOB:  1955/11/06, LOS: 3 ADMISSION DATE:  12/19/2019, CONSULTATION DATE:  12/19/2019 REFERRING MD: Baxter Hire Ward, DO , CHIEF COMPLAINT: Altered Mental Status  Brief History   [name] is a [name] with history of SDH s/p Burr hole in 2016, pulm HTN - on veletri infusion, Chronic respiratory failure on home oxygen, prothrombin mutation/ stable atrial fibrillation - on wafarin, chronic pain syndrome and C.diff in dec 2020 - on vancomycin who presents with altered mental status 2/2 SDH.   History of present illness   64 y.o. female with aforementioned hhx who her daughter reports fell 3 times several weeks ago. Daughter was made mention of the frequent falls after the last event when her step-father was not able to pick up patient from the floor. LOC during events is unknown. These episode of fall occurred in the past (2016) when the patient fell in the bathroom at a doctor's visit, hitting her head and suffering a SDH s/p burr hole. Her condition has been stable since to the point that she was placed on coumadin for her afib. Initially after the most recent events patient was found to be "ok" after falls and continued with her ADLs. During the week of 4/2 her daughter noticed some small changes in behavior with patient writhing hands in fabric and behaving as if she had "dementia". Patient and daughter were set to travel from their home in [place] to [place] to consult with a pulmonologist regarding a second opinion regarding her pulm HTN infusion, d/t infections from picc line. They stopped at a hotel and during the night the patient left the room and started knocking on the doors at which time EMS was called and patient was transported to [facility] ED.   On arrival patient was agitated, confused and aggressive, to the point that staff were threatened. ED administered Haldol, Geodon, Ativan and ketamine with little improvement and transitioned to a precedex infusion with IV  Versed.   CT scan w/o contrast revealed an intracranial bleed with rightward 7 mm midline shift and NSG and neurology were consulted. INR on arrival was 5.6. Patient was not intubated and transferred to 4N ICU.   Past Medical History  History of subdural hemorrhage History of pulmonary hypertension Ataxia Chronic pain syndrome Chronic respiratory failure on home oxygen Reformed smoker Depression Hypercoagulable state-history of prothrombin mutation Atrial fibrillation Chronic metabolic acidosis  Significant Hospital Events   Required multiple doses of medications to sedate this morning as she was very agitated-ketamine, Geodon, benzodiazepines, precedex infusion, versed.   Continues to be uncooperative and aggressive with staff  Consults:  Neurosurgery Neurology  Procedures:  2D ECHO  Significant Diagnostic Tests:  CT Head 12/20/19@11 :09  IMPRESSION: 1. Moderate acute subdural hematoma over the left convexity most prominent over the temporal region with there is focal bulbous prominence of this hematoma measuring 2 cm in thickness. This subdural hematoma appears to communicate with an area of acute parenchymal hemorrhage over the left temporal lobe measuring 2.2 x 3.1 cm. There is minimal associated acute subarachnoid hemorrhage over the left temporal region. Small amount of acute subdural hemorrhage over the anterior interhemispheric fissure as well as left tentorium. Local mass effect over the left convexity and mild midline shift to the right of 5-6 mm.  2. 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 Postsurgical defect over the left frontal parietal skull.  3. No acute facial bone  fracture.  4. No acute cervical spine injury.  5. Enlarged heterogeneous thyroid likely goiter. Recommend thyroid ultrasound (ref: J Am Coll Radiol. 2015 Feb;12(2): 143-50).  CT Head 12/19/19  @15 :29 IMPRESSION: 1. Stable extensive left side subdural hematoma, lobulated and ranging from 7 mm to 20 mm in thickness. 2. Stable left temporal lobe hemorrhagic contusion. Stable small volume left para falcine subdural blood. 3. Stable intracranial mass effect with rightward midline shift at 7 mm. 4. No new intracranial abnormality. 5. Mild bilateral mastoid effusions. No acute skull fracture Identified. CT head 4/12 > unchanged left temporal lobe contusion and left cerebral SDH. Micro Data:  SARS coronavirus negative Influenza A and B negative C.diff negative  Antimicrobials:  None   Interim history/subjective:  4/12 pm --> aggressive and agitated with staff. Was placed back on precedex infusion and required 2 mg versed.  4/13 am --> No acute events overnight  Objective   Blood pressure 135/70, pulse (!) 102, temperature 97.7 F (36.5 C), temperature source Axillary, resp. rate 17, height 5\' 8"  (1.727 m), weight 77.2 kg, SpO2 100 %.        Intake/Output Summary (Last 24 hours) at 12/22/2019 0730 Last data filed at 12/22/2019 0600 Gross per 24 hour  Intake 448.98 ml  Output 800 ml  Net -351.02 ml   Filed Weights   12/19/19 0422 12/22/19 0500  Weight: 77.1 kg 77.2 kg    Examination: General: Calm, confused elderly woman in NAD HENT: [place]/AT, EOMI, PERRLA, MMM, Neck soft and supple Lungs: CTAB, NWOB on Supp O2 Cardiovascular: RRR, No murmurs, distal pulses palpable  Abdomen: Soft ND/NT,  Extremities: WWP, upper extremities in soft restraints, moves lower extremities independently w/5/5 strength Neuro: Speech more fluent, oriented to person only. Repeatedly states that her mother is coming to visit. CN II - XII grossly intact.   Resolved Hospital Problem list   N/A  Assessment & Plan:  AMS 2/2 Acute left SDH in the setting of anticoagulation:  - D/c'd precedex  - Continue Keppra/Valproic acid - Continue Gabapentin 400 mg PO TID  - Versed PRN - Restraints in  place - NSG/Neurology following, appreciate recs - No surgery at this time, considering transferring back to [place] for extending recovery once stable w/NSG/Neuro f/u in [place] - Holding Coags - Goal SBP <140  Coagulopathy: Initial INR 5.6, now stable at 1.5 - Continuing to hold Wafarin   Pulmonary arterial HTN: long-standing h/o pulmonary hypertension with close follow-up in [place], seeking 2nd opinion from initial provider in 02/18/20.  - Continue Bosentan (Tracleer) 125 mg PO BID - Tadalafil (Alyq/Advirca) 40 mg daily at bedtime - Epoprostenol (Veletri) 60,000 ng/mL (100 mL) - running at 94 mL/day - Hold home Lasix - Decreased home Spironolactone by 1/2 - 2 D ECHO  Chronic RF on 2L - 3L home O2:  - Continue supplemental O2  Hypokalemia: K+ 2.9 down from 3.4 - Replete K and recheck K+ pm  AKI: Cr 1.37 from .95 and AA GFR 48 from 60.  - Hold home lasix - CTM  Depression/Chronic Pain/Insomnia:  - Continue home meds  Enlarged heterogenous thyroid noted on Maxillofacial CT: - TSH on 4/10 0.449 - Thyroid ultrasound and follow-up outpatient  Barriers to discharge/continued care: Patient was traveling out of home state. Family would like patient transferred to another hospital. Long-term extended care likely needed.  - Will follow-up with social work  [place]:  Diet: Thin Pain/Anxiety/Delirium protocol (if indicated): Versed PRN VAP protocol (if indicated): N/A DVT prophylaxis: SCDs  GI prophylaxis: PPI Glucose control: will CTM Mobility: Bedrest, soft restraints in place Code Status: Full Code Family Communication: Updated Daughter 4/12 Disposition: Neuro ICU, Daughter requests transfer will request Social work concerning matter.   Labs   CBC: Recent Labs  Lab 12/19/19 0625 12/19/19 0635 12/20/19 0528 12/21/19 0248 12/22/19 0620  WBC 5.0  --  3.8* 4.4 7.4  NEUTROABS 3.3  --   --   --   --   HGB 12.6 12.2 9.1* 9.0* 11.0*  HCT 40.8 36.0 28.6* 28.7* 35.3*  MCV 77.3*  --   76.9* 79.1* 79.0*  PLT 223  --  183 183 299    Basic Metabolic Panel: Recent Labs  Lab 12/19/19 0625 12/19/19 0625 12/19/19 0635 12/19/19 0635 12/20/19 0528 12/20/19 1130 12/21/19 0248 12/21/19 1111 12/21/19 1704 12/21/19 2255 12/22/19 0620  NA 134*   < > 137   < > 147*   < > 157* 159* 159* 157* 156*  K 3.7  --  3.7  --  3.3*  --  3.4*  --   --   --  2.9*  CL 109  --   --   --  126*  --  >130*  --   --   --  128*  CO2 11*  --   --   --  14*  --  16*  --   --   --  16*  GLUCOSE 115*  --   --   --  178*  --  141*  --   --   --  129*  BUN 24*  --   --   --  18  --  16  --   --   --  18  CREATININE 1.56*  --   --   --  0.93  --  0.95  --   --   --  1.37*  CALCIUM 10.5*  --   --   --  9.3  --  9.8  --   --   --  10.4*  MG  --   --   --   --   --   --  1.8  --   --   --  1.8  PHOS  --   --   --   --   --   --   --   --   --   --  3.2   < > = values in this interval not displayed.   GFR: Estimated Creatinine Clearance: 47.1 mL/min (A) (by C-G formula based on SCr of 1.37 mg/dL (H)). Recent Labs  Lab 12/19/19 0625 12/20/19 0528 12/21/19 0248 12/22/19 0620  WBC 5.0 3.8* 4.4 7.4  LATICACIDVEN 1.0  --   --   --     Liver Function Tests: Recent Labs  Lab 12/19/19 0625  AST 15  ALT 9  ALKPHOS 91  BILITOT 0.3  PROT 8.5*  ALBUMIN 4.0   No results for input(s): LIPASE, AMYLASE in the last 168 hours. Recent Labs  Lab 12/19/19 0625  AMMONIA 39*    ABG    Component Value Date/Time   PHART 7.348 (L) 12/19/2019 0635   PCO2ART 20.6 (L) 12/19/2019 0635   PO2ART 120.0 (H) 12/19/2019 0635   HCO3 11.3 (L) 12/19/2019 0635   TCO2 12 (L) 12/19/2019 0635   ACIDBASEDEF 12.0 (H) 12/19/2019 0635   O2SAT 99.0 12/19/2019 0635     Coagulation Profile: Recent Labs  Lab 12/19/19 0625 12/19/19 1227  12/20/19 0528 12/21/19 0537 12/22/19 0620  INR 5.6* 1.4* 1.4* 1.4* 1.5*    Cardiac Enzymes: Recent Labs  Lab 12/19/19 0625  CKTOTAL 181    HbA1C: No results found for:  HGBA1C  CBG: Recent Labs  Lab 12/19/19 0436  GLUCAP 104*    Review of Systems:   No significant findings outside HPI and interval events.   Past Medical History  She,  has no past medical history on file.   Surgical History   History reviewed. No pertinent surgical history.   Social History   reports that she has never smoked. She has never used smokeless tobacco. She reports current alcohol use. She reports current drug use.   Family History   Her family history is not on file.   Allergies Allergies  Allergen Reactions  . Codeine Rash     Home Medications  Prior to Admission medications   Medication Sig Start Date End Date Taking? Authorizing Provider  bosentan (TRACLEER) 125 MG tablet Take 125 mg by mouth 2 (two) times daily.   Yes [provider]  busPIRone (BUSPAR) 10 MG tablet Take 10 mg by mouth 2 (two) times daily.   Yes [provider]  cholecalciferol (VITAMIN D3) 25 MCG (1000 UNIT) tablet Take 1,000 Units by mouth daily.   Yes [provider]  dicyclomine (BENTYL) 10 MG capsule Take 10 mg by mouth 4 (four) times daily -  before meals and at bedtime.   Yes [provider]  diphenoxylate-atropine (LOMOTIL) 2.5-0.025 MG tablet Take 1 tablet by mouth 4 (four) times daily as needed for diarrhea or loose stools.   Yes [provider]  epoprostenol (VELETRI) 1.5 MG injection Inject 6,000 mcg into the vein continuous. 60,000 ng/ml in solution infused over 24 hours. ( 4 vials per cassette)   Yes [provider]  ferrous sulfate 325 (65 FE) MG tablet Take 325 mg by mouth daily with breakfast.   Yes [provider]  furosemide (LASIX) 80 MG tablet Take 80 mg by mouth 2 (two) times daily.   Yes [provider]  gabapentin (NEURONTIN) 800 MG tablet Take 800 mg by mouth 3 (three) times daily.   Yes [provider]  hydrocortisone-pramoxine (PROCTOFOAM-HC) rectal foam Place 1 applicator  rectally 2 (two) times daily.   Yes [provider]  lactobacillus acidophilus (BACID) TABS tablet Take 2 tablets by mouth 2 (two) times daily.   Yes [provider]  LORazepam (ATIVAN) 1 MG tablet Take 1 mg by mouth 2 (two) times daily as needed for anxiety.   Yes [provider]  magnesium gluconate (MAGONATE) 500 MG tablet Take 500 mg by mouth 2 (two) times daily.   Yes [provider]  Multiple Vitamins-Minerals (MULTIVITAMIN WITH MINERALS) tablet Take 1 tablet by mouth daily.   Yes [provider]  OXYGEN Inhale 3 L into the lungs continuous.   Yes [provider]  potassium chloride (KLOR-CON) 10 MEQ tablet Take 40 mEq by mouth 2 (two) times daily.   Yes [provider]  sodium bicarbonate 650 MG tablet Take 650 mg by mouth 2 (two) times daily.   Yes [provider]  spironolactone (ALDACTONE) 100 MG tablet Take 100 mg by mouth 2 (two) times daily.   Yes [provider]  tadalafil, PAH, (ADCIRCA) 20 MG tablet Take 40 mg by mouth at bedtime.   Yes [provider]  temazepam (RESTORIL) 30 MG capsule Take 30 mg by mouth at bedtime.  Yes [provider]  warfarin (COUMADIN) 10 MG tablet Take 10 mg by mouth daily.   Yes [provider]     This note was initiated by Luanna Salkerrick C. Delton SeeNelson, MS4.

## 2019-12-22 NOTE — Progress Notes (Signed)
1145:  Patient is having more frequent runs of SVTs.  CCM paged and are aware.  No new orders at this time.

## 2019-12-23 ENCOUNTER — Inpatient Hospital Stay (HOSPITAL_COMMUNITY): Payer: Medicare HMO

## 2019-12-23 DIAGNOSIS — S065X9A Traumatic subdural hemorrhage with loss of consciousness of unspecified duration, initial encounter: Secondary | ICD-10-CM | POA: Diagnosis not present

## 2019-12-23 DIAGNOSIS — G9340 Encephalopathy, unspecified: Secondary | ICD-10-CM | POA: Diagnosis not present

## 2019-12-23 LAB — BASIC METABOLIC PANEL
Anion gap: 11 (ref 5–15)
Anion gap: 12 (ref 5–15)
Anion gap: 8 (ref 5–15)
BUN: 12 mg/dL (ref 8–23)
BUN: 13 mg/dL (ref 8–23)
BUN: 13 mg/dL (ref 8–23)
CO2: 15 mmol/L — ABNORMAL LOW (ref 22–32)
CO2: 18 mmol/L — ABNORMAL LOW (ref 22–32)
CO2: 20 mmol/L — ABNORMAL LOW (ref 22–32)
Calcium: 10 mg/dL (ref 8.9–10.3)
Calcium: 10.1 mg/dL (ref 8.9–10.3)
Calcium: 10.1 mg/dL (ref 8.9–10.3)
Chloride: 118 mmol/L — ABNORMAL HIGH (ref 98–111)
Chloride: 118 mmol/L — ABNORMAL HIGH (ref 98–111)
Chloride: 119 mmol/L — ABNORMAL HIGH (ref 98–111)
Creatinine, Ser: 1.22 mg/dL — ABNORMAL HIGH (ref 0.44–1.00)
Creatinine, Ser: 1.32 mg/dL — ABNORMAL HIGH (ref 0.44–1.00)
Creatinine, Ser: 1.35 mg/dL — ABNORMAL HIGH (ref 0.44–1.00)
GFR calc Af Amer: 49 mL/min — ABNORMAL LOW (ref >60)
GFR calc Af Amer: 50 mL/min — ABNORMAL LOW (ref >60)
GFR calc Af Amer: 55 mL/min — ABNORMAL LOW (ref >60)
GFR calc non Af Amer: 42 mL/min — ABNORMAL LOW (ref >60)
GFR calc non Af Amer: 43 mL/min — ABNORMAL LOW (ref >60)
GFR calc non Af Amer: 48 mL/min — ABNORMAL LOW (ref >60)
Glucose, Bld: 122 mg/dL — ABNORMAL HIGH (ref 70–99)
Glucose, Bld: 95 mg/dL (ref 70–99)
Glucose, Bld: 99 mg/dL (ref 70–99)
Potassium: 3 mmol/L — ABNORMAL LOW (ref 3.5–5.1)
Potassium: 3.2 mmol/L — ABNORMAL LOW (ref 3.5–5.1)
Potassium: 3.4 mmol/L — ABNORMAL LOW (ref 3.5–5.1)
Sodium: 146 mmol/L — ABNORMAL HIGH (ref 135–145)
Sodium: 146 mmol/L — ABNORMAL HIGH (ref 135–145)
Sodium: 147 mmol/L — ABNORMAL HIGH (ref 135–145)

## 2019-12-23 LAB — CBC WITH DIFFERENTIAL/PLATELET
Abs Immature Granulocytes: 0.03 10*3/uL (ref 0.00–0.07)
Basophils Absolute: 0 10*3/uL (ref 0.0–0.1)
Basophils Relative: 1 %
Eosinophils Absolute: 0.1 10*3/uL (ref 0.0–0.5)
Eosinophils Relative: 1 %
HCT: 31.6 % — ABNORMAL LOW (ref 36.0–46.0)
Hemoglobin: 10.2 g/dL — ABNORMAL LOW (ref 12.0–15.0)
Immature Granulocytes: 1 %
Lymphocytes Relative: 22 %
Lymphs Abs: 1.2 10*3/uL (ref 0.7–4.0)
MCH: 24.5 pg — ABNORMAL LOW (ref 26.0–34.0)
MCHC: 32.3 g/dL (ref 30.0–36.0)
MCV: 76 fL — ABNORMAL LOW (ref 80.0–100.0)
Monocytes Absolute: 0.3 10*3/uL (ref 0.1–1.0)
Monocytes Relative: 6 %
Neutro Abs: 3.9 10*3/uL (ref 1.7–7.7)
Neutrophils Relative %: 69 %
Platelets: 252 10*3/uL (ref 150–400)
RBC: 4.16 MIL/uL (ref 3.87–5.11)
RDW: 19.6 % — ABNORMAL HIGH (ref 11.5–15.5)
WBC: 5.6 10*3/uL (ref 4.0–10.5)
nRBC: 0 % (ref 0.0–0.2)

## 2019-12-23 LAB — PHOSPHORUS
Phosphorus: 2.3 mg/dL — ABNORMAL LOW (ref 2.5–4.6)
Phosphorus: 2.6 mg/dL (ref 2.5–4.6)

## 2019-12-23 LAB — SODIUM
Sodium: 146 mmol/L — ABNORMAL HIGH (ref 135–145)
Sodium: 149 mmol/L — ABNORMAL HIGH (ref 135–145)

## 2019-12-23 LAB — PROTIME-INR
INR: 1.4 — ABNORMAL HIGH (ref 0.8–1.2)
INR: 1.4 — ABNORMAL HIGH (ref 0.8–1.2)
Prothrombin Time: 17.2 seconds — ABNORMAL HIGH (ref 11.4–15.2)
Prothrombin Time: 17.4 seconds — ABNORMAL HIGH (ref 11.4–15.2)

## 2019-12-23 LAB — MAGNESIUM
Magnesium: 2.2 mg/dL (ref 1.7–2.4)
Magnesium: 2.3 mg/dL (ref 1.7–2.4)

## 2019-12-23 MED ORDER — POTASSIUM CHLORIDE CRYS ER 20 MEQ PO TBCR
40.0000 meq | EXTENDED_RELEASE_TABLET | Freq: Once | ORAL | Status: AC
  Administered 2019-12-23: 40 meq via ORAL
  Filled 2019-12-23: qty 2

## 2019-12-23 MED ORDER — BOSENTAN 62.5 MG PO TABS
125.0000 mg | ORAL_TABLET | Freq: Two times a day (BID) | ORAL | Status: DC
  Administered 2019-12-23 – 2019-12-26 (×6): 125 mg via ORAL
  Filled 2019-12-23 (×4): qty 2
  Filled 2019-12-23: qty 1
  Filled 2019-12-23 (×2): qty 2

## 2019-12-23 NOTE — Evaluation (Signed)
Physical Therapy Evaluation Patient Details Name: Elizabeth Shepherd MRN: [id number] DOB: 09-Aug-1956 Today's Date: 12/23/2019   History of Present Illness  Pt is 64 y/o female who was brought into the emergency room following being found knocking on people's doors at a hotel. PMH includes PAH on vasodilator therapy, chronic hypoxemic respiratory failure, anticoagulation with a prior SDH (treated surgically). She has been having falls, instability, was admitted with recurrent left-sided SDH about 2 days following a fall.  Clinical Impression  Pt was able to participate in transfer to [facility] assisting in ADL EOB and peri care after toileting.  She is unaware of her R UE/LE weakness (greater than left), has difficulty sequencing and initiating movement when asked, and is disoriented to her situation, time and place.  She would benefit from post acute rehab, but there seem to be plans for possible transfer to [facility] in [place] for further care.  She would benefit from further acute therapy at that facility as well.   PT to follow acutely for deficits listed below.      Follow Up Recommendations SNF    Equipment Recommendations  3in1 (PT)    Recommendations for Other Services   NA     Precautions / Restrictions Precautions Precautions: Fall;Other (comment) Precaution Comments: Has some kind of medication administration box that connects to a port in her L chest wall.       Mobility  Bed Mobility Overal bed mobility: Needs Assistance Bed Mobility: Supine to Sit;Sit to Supine     Supine to sit: Min assist Sit to supine: Min assist   General bed mobility comments: Min assist with multimodal cues to initiate movement to EOB.  Most assist was to bring legs over and then separately elevate trunk, pt assisting once motion was initiated.   Transfers Overall transfer level: Needs assistance Equipment used: 2 person hand held assist Transfers: Sit to/from [employer] Sit to Stand: Min  assist;+2 physical assistance Stand pivot transfers: Mod assist;+2 physical assistance       General transfer comment: Min assist to power up to standing, but once standing mod assist to help stabilize and progress R LE during pivotal steps to the [facility] and back to the bed.   Ambulation/Gait             General Gait Details: Not attempted this session would be safer with RW and second person assist.   Stairs            Wheelchair Mobility    Modified Rankin (Stroke Patients Only)       Balance Overall balance assessment: Needs assistance Sitting-balance support: Feet supported;Bilateral upper extremity supported Sitting balance-Leahy Scale: Poor Sitting balance - Comments: was able to obtain brief periods of supervision, but close assist needed- min A to maintain sitting balance EOB and to maintain safety. Increased assist needed when donning socks EOB due to posterior tipping.  Postural control: Posterior lean Standing balance support: Bilateral upper extremity supported;During functional activity Standing balance-Leahy Scale: Poor Standing balance comment: min to mod assist in standing with narrow BOS.                             Pertinent Vitals/Pain Pain Assessment: Faces Faces Pain Scale: Hurts little more Pain Location: generalized, cannot localize Pain Descriptors / Indicators: Aching;Moaning Pain Intervention(s): Limited activity within patient's tolerance;Monitored during session;Repositioned    Home Living Family/patient expects to be discharged to:: Unsure  Additional Comments: It sounds like family is going to take her to [place] to [facility] for further care.     Prior Function           Comments: Pt is a poor historian, no family to confirm reports.  Mentions a RW.      Hand Dominance        Extremity/Trunk Assessment   Upper Extremity Assessment Upper Extremity Assessment: Defer to OT evaluation    Lower  Extremity Assessment Lower Extremity Assessment: RLE deficits/detail;LLE deficits/detail RLE Deficits / Details: right leg seems weaker than left leg with more of a buckling moment in stance wiht difficulty progressing the leg around when pivoting to the [facility] on her left.  LLE Deficits / Details: left leg is weak, but stronger functionally than R leg.  At least 3+/5 grossly on this side.     Cervical / Trunk Assessment Cervical / Trunk Assessment: Normal  Communication   Communication: Expressive difficulties  Cognition Arousal/Alertness: Awake/alert Behavior During Therapy: Restless;Impulsive Overall Cognitive Status: Impaired/Different from baseline Area of Impairment: Orientation;Memory;Attention;Following commands;Safety/judgement;Awareness                 Orientation Level: Disoriented to;Place;Time;Situation Current Attention Level: Sustained(can sustain breifly to washing face and peri care) Memory: Decreased recall of precautions;Decreased short-term memory Following Commands: Follows one step commands inconsistently Safety/Judgement: Decreased awareness of safety;Decreased awareness of deficits Awareness: Intellectual Problem Solving: Difficulty sequencing;Requires verbal cues;Requires tactile cues;Decreased initiation;Slow processing General Comments: Pt unable to report where she is or why, unaware of her LE weakness or imbalance while we transferred with heavy assist to [facility], asking why she can't just walk into the bathroom.       General Comments General comments (skin integrity, edema, etc.): VSS throughout on RA.    Exercises     Assessment/Plan    PT Assessment Patient needs continued PT services  PT Problem List Decreased strength;Decreased activity tolerance;Decreased balance;Decreased mobility;Decreased cognition;Decreased knowledge of use of DME;Decreased safety awareness;Decreased knowledge of precautions       PT Treatment Interventions DME  instruction;Gait training;Stair training;Functional mobility training;Therapeutic activities;Balance training;Therapeutic exercise;Neuromuscular re-education;Cognitive remediation;Patient/family education;Wheelchair mobility training    PT Goals (Current goals can be found in the Care Plan section)  Acute Rehab PT Goals Patient Stated Goal: to be independent, return home to her husband PT Goal Formulation: With patient Time For Goal Achievement: 01/06/20 Potential to Achieve Goals: Good    Frequency Min 2X/week   Barriers to discharge        Co-evaluation PT/OT/SLP Co-Evaluation/Treatment: Yes Reason for Co-Treatment: Complexity of the patient's impairments (multi-system involvement);For patient/therapist safety;Necessary to address cognition/behavior during functional activity;To address functional/ADL transfers PT goals addressed during session: Mobility/safety with mobility;Balance;Strengthening/ROM         AM-PAC PT "6 Clicks" Mobility  Outcome Measure Help needed turning from your back to your side while in a flat bed without using bedrails?: A Little Help needed moving from lying on your back to sitting on the side of a flat bed without using bedrails?: A Little Help needed moving to and from a bed to a chair (including a wheelchair)?: A Lot Help needed standing up from a chair using your arms (e.g., wheelchair or bedside chair)?: A Little Help needed to walk in hospital room?: A Lot Help needed climbing 3-5 steps with a railing? : Total 6 Click Score: 14    End of Session   Activity Tolerance: Patient tolerated treatment well Patient left: in bed;with call bell/phone within reach;with  bed alarm set;with restraints reapplied Nurse Communication: Mobility status PT Visit Diagnosis: Muscle weakness (generalized) (M62.81);Difficulty in walking, not elsewhere classified (R26.2);Other symptoms and signs involving the nervous system (R29.898)    Time: 1424-1500 PT Time  Calculation (min) (ACUTE ONLY): 36 min   Charges:   PT Evaluation $PT Eval Moderate Complexity: L[place] Lloyd, PT, DPT  Acute Rehabilitation [phone] pager [phone]) [phone] office

## 2019-12-23 NOTE — Progress Notes (Signed)
STROKE TEAM PROGRESS NOTE       INTERVAL HISTORY Her daughter is at the bedside.  Patient appears less agitated this a.m. but she remains aphasic confused she has been off hypertonic saline now for 2 days and serum sodium is 146.  Repeat CT scan of the head this morning shows stable appearance of the subdural hemorrhage and contusion with 5 mm midline shift which is unchanged..  OBJECTIVE Vitals:   12/23/19 0800 12/23/19 0900 12/23/19 1000 12/23/19 1100  BP: 98/65 104/65 96/69 100/67  Pulse: (!) 103 100 (!) 155 100  Resp: 18 14 (!) 32 (!) 22  Temp:      TempSrc:      SpO2: 98% 100% 100% 94%  Weight:      Height:        CBC:  Recent Labs  Lab 12/19/19 0625 12/19/19 0635 12/21/19 0248 12/22/19 0620  WBC 5.0   < > 4.4 7.4  NEUTROABS 3.3  --   --   --   HGB 12.6   < > 9.0* 11.0*  HCT 40.8   < > 28.7* 35.3*  MCV 77.3*   < > 79.1* 79.0*  PLT 223   < > 183 269   < > = values in this interval not displayed.    Basic Metabolic Panel:  Recent Labs  Lab 12/22/19 2354 12/22/19 2354 12/23/19 0648 12/23/19 0836  NA 146*   < > 147* 146*  K 3.0*   < > 3.4* 3.2*  CL 119*   < > 118* 118*  CO2 15*   < > 18* 20*  GLUCOSE 122*   < > 95 99  BUN 12   < > 13 13  CREATININE 1.35*   < > 1.32* 1.22*  CALCIUM 10.1   < > 10.1 10.0  MG 2.2  --   --  2.3  PHOS 2.3*  --   --  2.6   < > = values in this interval not displayed.    Lipid Panel: No results found for: CHOL, TRIG, HDL, CHOLHDL, VLDL, LDLCALC HgbA1c: No results found for: HGBA1C Urine Drug Screen:     Component Value Date/Time   LABOPIA NONE DETECTED 12/19/2019 0734   COCAINSCRNUR NONE DETECTED 12/19/2019 0734   LABBENZ POSITIVE (A) 12/19/2019 0734   AMPHETMU NONE DETECTED 12/19/2019 0734   THCU NONE DETECTED 12/19/2019 0734   LABBARB NONE DETECTED 12/19/2019 0734    Alcohol Level     Component Value Date/Time   ETH <10 12/19/2019 0625    IMAGING  CT HEAD WO CONTRAST 12/19/2019 IMPRESSION:  1. Stable extensive  left side subdural hematoma, lobulated and ranging from 7 mm to 20 mm in thickness.  2. Stable left temporal lobe hemorrhagic contusion. Stable small volume left para falcine subdural blood.  3. Stable intracranial mass effect with rightward midline shift at 7 mm.  4. No new intracranial abnormality.  5. Mild bilateral mastoid effusions. No acute skull fracture identified.    CT Head Wo Contrast CT Maxillofacial Wo Contrast 12/19/2019 IMPRESSION:  1. Moderate acute subdural hematoma over the left convexity most prominent over the temporal region with there is focal bulbous prominence of this hematoma measuring 2 cm in thickness. This subdural hematoma appears to communicate with an area of acute parenchymal hemorrhage over the left temporal lobe measuring 2.2 x 3.1 cm. There is minimal associated acute subarachnoid hemorrhage over the left temporal region. Small amount of acute subdural hemorrhage over the anterior interhemispheric fissure  as well as left tentorium. Local mass effect over the left convexity and mild midline shift to the right of 5-6 mm.  2. 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 Postsurgical defect over the left frontal parietal skull.  3.  No acute facial bone fracture.  4.  No acute cervical spine injury.  5. Enlarged heterogeneous thyroid likely goiter. Recommend thyroid ultrasound (ref: J Am Coll Radiol. 2015 Feb;12(2): 143-50).   CT Head Wo Contrast - Unchanged left temporal lobe contusion and left cerebral convexity subdural hemorrhage.  12/21/19   CT Cervical Spine Wo Contrast 12/19/2019 IMPRESSION:  No acute cervical spine injury.   DG Chest Portable 1 View 12/19/2019 IMPRESSION:  1. 19 mm nodular density overlying the right infrahilar region, suspected to be vascular in nature related to the underlying right pulmonary arteries. A possible underlying nodule/mass could also have this  appearance. Further assessment with dedicated cross-sectional imaging of the chest suggested for further evaluation.  2. Mild cardiomegaly. No other active cardiopulmonary disease.   DG Shoulder Left Portable 12/19/2019 IMPRESSION:  Negative.  DG Shoulder Right Portable 12/19/2019 IMPRESSION:  No acute osseous abnormality.    ECG - ST rate 101 BPM. (See cardiology reading for complete details)   PHYSICAL EXAM Blood pressure 100/67, pulse 100, temperature (!) 97.5 F (36.4 C), temperature source Axillary, resp. rate (!) 22, height 5\' 8"  (1.727 m), weight 76.8 kg, SpO2 94 %. Pleasant middle-age African-American lady not in distress. . Afebrile. Head is nontraumatic. Neck is supple without bruit.    Cardiac exam no murmur or gallop. Lungs are clear to auscultation. Distal pulses are well felt.  Neurological Exam :  Patient is awake and interactive.  She appears globally aphasic with nonfluent speech but able to answers simple questions and speaks a few words and short sentences.  She has trouble with naming and repetition.  Follows only occasional simple one-step commands.  She is disoriented to place and person.  Extraocular movements are full range without nystagmus.  She blinks to threat more on the left than the right.  Right lower facial weakness.  Tongue midline.  Motor system exam she is able to move all 4 extremities equally well against gravity but will not cooperate if for detailed muscle testing.  Sensation appears preserved bilaterally.  Plantars are downgoing.  Gait not tested  ASSESSMENT/PLAN Ms. RENDA POHLMAN is a 64 y.o. 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  She did not receive IV  t-PA due to ICH.  Extensive left side subdural hematoma  Resultant altered mental status and aphasia  Code Stroke CT Head - not ordered  CT head - 12/19/19 - Stable extensive left side subdural hematoma, lobulated and ranging from 7 mm to 20 mm in thickness. Stable left temporal lobe hemorrhagic contusion. Stable small volume left para falcine subdural blood. Stable intracranial mass effect with rightward midline shift at 7 mm.   CT Head - 12/19/19 - Moderate acute subdural hematoma over the left convexity most prominent over the temporal region with there is focal bulbous prominence of this hematoma measuring 2 cm in thickness. This subdural hematoma appears to communicate with an area of acute parenchymal hemorrhage over  the left temporal lobe measuring 2.2 x 3.1 cm. There is minimal associated acute subarachnoid hemorrhage over the left temporal region. Small amount of acute subdural hemorrhage over the anterior interhemispheric fissure as well as left tentorium. Local mass effect over the left convexity and mild midline shift to the right of 5-6 mm. 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 Postsurgical defect over the left frontal parietal skull. CT Head - 12/21/19 - Unchanged left temporal lobe contusion and left cerebral convexity subdural hemorrhage.    MRI head - not ordered  MRA head - not ordered  CTA H&N - not ordered  CT Perfusion - not ordered  Carotid Doppler - not ordered  2D Echo - not ordered  [employer] Virus 2 - negative  LDL - not ordered  HgbA1c - not ordered  UDS - benzodiazepine (home meds include ativan)  VTE prophylaxis - SCDs Diet  Diet Order            DIET DYS 2 Room service appropriate? No; Fluid consistency: Thin  Diet effective now              warfarin daily prior to admission, now on No antithrombotic  Ongoing aggressive stroke risk factor  management  Therapy recommendations:  pending  Disposition:  Pending  Hypertension  Home BP meds: none (aldactone)  Current BP meds: none (diuretics)  Pt is mildly hypotensive . SBP goal < 140 mm Hg initially . Long-term BP goal normotensive  Other Stroke Risk Factors  Advanced age  Former cigarette smoker - quit  ETOH use, advised to drink no more than 1 alcoholic beverage per day.   Atrial fibrillation  Substance Abuse  Other Active Problems  Code status - Full code  Coumadin PTA for venous thromboembolism (INR 5.6 on admission) - reversed with Kcentra and Vitamin K ->INR1.4  CT Head - Enlarged heterogeneous thyroid likely goiter. Recommend thyroid ultrasound  CKD stage 3b - Creatinine - 1.56->0.93  Elevated ammonia - 39  Abnormal CXR - 19 mm nodular density overlying the right infrahilar region, suspected to be vascular in nature related to the underlying right pulmonary arteries. A possible underlying nodule/mass could also have this appearance. Further assessment with dedicated cross-sectional imaging of the chest suggested for further evaluation.   Anemia - Hb - 12.6->12.2->9.1  Hypokalemia - 3.7->3.3  Mild bradycardia - 50's   Hospital day # 4 Continue conservative treatment.  Strict blood pressure control. .  Continue valproic acid 500 mg   every 8 hourly for agitation as well as seizure prevention.  Plans are to transfer the patient to [facility] in [place] as per family request..  Long discussion with patient and daughter and Dr. Lamonte Sakai and answered questions.  Greater than 50% time during the 35-minute visit was spent on counseling and coordination of care and discussion with patient, family and care team and answering questions stroke team will sign off kindly call for questions.  Antony Contras, MD  To contact Stroke Continuity provider, please refer to [email or web address] After hours, contact General Neurology

## 2019-12-23 NOTE — Progress Notes (Signed)
NAME:  Elizabeth Shepherd, MRN:  [id number], DOB:  [birth year], LOS: 4 ADMISSION DATE:  12/19/2019, CONSULTATION DATE:  12/19/2019 REFERRING MD: Baxter Hire Ward, DO , CHIEF COMPLAINT: Altered Mental Status  Brief History   Elizabeth Shepherd is a [name] with history of SDH s/p Burr hole in 2016, pulm HTN - on veletri infusion, Chronic respiratory failure on home oxygen, prothrombin mutation/ stable atrial fibrillation - on wafarin, chronic pain syndrome and C.diff in dec 2020 - on vancomycin who presents with altered mental status 2/2 SDH.   History of present illness   64 y.o. female with aforementioned hhx who her daughter reports fell 3 times several weeks ago. Daughter was made mention of the frequent falls after the last event when her step-father was not able to pick up patient from the floor. LOC during events is unknown. These episode of fall occurred in the past (2016) when the patient fell in the bathroom at a doctor's visit, hitting her head and suffering a SDH s/p burr hole. Her condition has been stable since to the point that she was placed on coumadin for her afib. Initially after the most recent events patient was found to be "ok" after falls and continued with her ADLs. During the week of 4/2 her daughter noticed some small changes in behavior with patient writhing hands in fabric and behaving as if she had "dementia". Patient and daughter were set to travel from their home in [place] to [place] to consult with a pulmonologist regarding a second opinion regarding her pulm HTN infusion, d/t infections from picc line. They stopped at a hotel and during the night the patient left the room and started knocking on the doors at which time EMS was called and patient was transported to [facility] ED.   On arrival patient was agitated, confused and aggressive, to the point that staff were threatened. ED administered Haldol, Geodon, Ativan and ketamine with little improvement and transitioned to a precedex infusion with IV  Versed.   CT scan w/o contrast revealed an intracranial bleed with rightward 7 mm midline shift and NSG and neurology were consulted. INR on arrival was 5.6. Patient was not intubated and transferred to 4N ICU.   Past Medical History  History of subdural hemorrhage History of pulmonary hypertension Ataxia Chronic pain syndrome Chronic respiratory failure on home oxygen Reformed smoker Depression Hypercoagulable state-history of prothrombin mutation Atrial fibrillation Chronic metabolic acidosis  Significant Hospital Events   Required multiple doses of medications to sedate this morning as she was very agitated-ketamine, Geodon, benzodiazepines, precedex infusion, versed.   Consults:  Neurosurgery Neurology  Procedures:  2D ECHO  Significant Diagnostic Tests:  CT Head 12/23/19@03 :24  IMPRESSION: 1. Unchanged size of large left hemisphere mixed intra- and extra-axial hemorrhage with 5 mm of rightward midline shift. 2. No new site of hemorrhage.  2D ECHO 12/22/19@12 :40  IMPRESSIONS  1. Left ventricular ejection fraction, by estimation, is 60 to 65%. The  left ventricle has normal function. The left ventricle has no regional  wall motion abnormalities. Left ventricular diastolic parameters were  normal.  2. Right ventricular systolic function is normal. The right ventricular  size is normal. There is normal pulmonary artery systolic pressure.  3. Left atrial size was mildly dilated.  4. The mitral valve is normal in structure. No evidence of mitral valve  regurgitation. No evidence of mitral stenosis.  5. The aortic valve is normal in structure. Aortic valve regurgitation is  not visualized. No aortic stenosis is  present.  6. The inferior vena cava is normal in size with greater than 50%  respiratory variability, suggesting right atrial pressure of 3 mmHg.   CT Head 12/20/19@11 :09  IMPRESSION: 1. Moderate acute subdural hematoma over the left convexity  most prominent over the temporal region with there is focal bulbous prominence of this hematoma measuring 2 cm in thickness. This subdural hematoma appears to communicate with an area of acute parenchymal hemorrhage over the left temporal lobe measuring 2.2 x 3.1 cm. There is minimal associated acute subarachnoid hemorrhage over the left temporal region. Small amount of acute subdural hemorrhage over the anterior interhemispheric fissure as well as left tentorium. Local mass effect over the left convexity and mild midline shift to the right of 5-6 mm.  2. 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 Postsurgical defect over the left frontal parietal skull.  3. No acute facial bone fracture.  4. No acute cervical spine injury.  5. Enlarged heterogeneous thyroid likely goiter. Recommend thyroid ultrasound (ref: J Am Coll Radiol. 2015 Feb;12(2): 143-50).  CT Head 12/19/19 @15 :29 IMPRESSION: 1. Stable extensive left side subdural hematoma, lobulated and ranging from 7 mm to 20 mm in thickness. 2. Stable left temporal lobe hemorrhagic contusion. Stable small volume left para falcine subdural blood. 3. Stable intracranial mass effect with rightward midline shift at 7 mm. 4. No new intracranial abnormality. 5. Mild bilateral mastoid effusions. No acute skull fracture Identified. CT head 4/12 > unchanged left temporal lobe contusion and left cerebral SDH. Micro Data:  SARS coronavirus negative Influenza A and B negative C.diff negative  Antimicrobials:  None   Interim history/subjective:  4/12 pm --> aggressive and agitated with staff. Was placed back on precedex infusion and required 2 mg versed.  4/13 am --> No acute events overnight 4/13 pm --> Maintaining adequate SpO2 on RA 4/14 am--> Did attempt to get OOB, posey restraints reordered. Required 1mg  of versed, otherwise no acute events  overnight   Objective   Blood pressure (!) 86/57, pulse 98, temperature (!) 97.5 F (36.4 C), temperature source Axillary, resp. rate 18, height 5\' 8"  (1.727 m), weight 76.8 kg, SpO2 99 %.        Intake/Output Summary (Last 24 hours) at 12/23/2019 0740 Last data filed at 12/23/2019 0600 Gross per 24 hour  Intake 626.84 ml  Output 1000 ml  Net -373.16 ml   Filed Weights   12/19/19 0422 12/22/19 0500 12/23/19 0500  Weight: 77.1 kg 77.2 kg 76.8 kg    Examination: General: Calm, elderly woman lying in bed in NAD HENT: [place]/AT, EOMI, MMM, Neck soft and supple. Symmetrical facial flushing. Lungs: CTAB, NWOB on RA Cardiovascular: RRR, No murmurs, distal pulses palpable  Abdomen: Soft ND/NT  Extremities: WWP, upper extremities 4/5 strength, moves lower extremities independently w/5/5 strength Neuro: Speech more fluent, oriented to person only. Follows simple commands.    Resolved Hospital Problem list   N/A  Assessment & Plan:  AMS 2/2 Acute left SDH in the setting of anticoagulation:  - D/c'd precedex  - Continue Keppra/Valproic acid - Continue Gabapentin 400 mg PO TID  - Versed PRN - Soft restraints in place - NSG/Neurology following, appreciate recs - No surgery at this time, considering transferring back to [place] for extending recovery once stable w/NSG/Neuro f/u in [place] - Per NSG, 2 wks for resumption of anticoagulation, but if elevated risk of thrombosis consider heparin gtt 1 wk post bleed with interval CT when  PTT in therapeutic range - Holding Coags - Goal SBP <140  Coagulopathy: Initial INR 5.6, continues to be stable at 1.4 - Continuing to hold Wafarin   Pulmonary arterial HTN: long-standing h/o pulmonary hypertension with close follow-up in [place], seeking 2nd opinion from initial provider in [place]. No significant findings on 2D echo. - Continue Bosentan (Tracleer) 125 mg PO BID - Tadalafil (Alyq/Advirca) 40 mg daily at bedtime - Epoprostenol (Veletri) 60,000 ng/mL (100 mL) -  running at 94 mL/day - Hold home Lasix - Continue Spironolactone 50 mg BID   Chronic RF on 2L - 3L home O2: Currently satting well on RA - Continue supplemental O2, as needed  Hypokalemia: K+ 3.0 from 3.3 - Replete K and recheck BMP  AKI: Cr 1.35 from 1.37  - Hold home lasix - CTM  Depression/Chronic Pain/Insomnia:  - Continue home meds  Enlarged heterogenous thyroid noted on Maxillofacial CT: - TSH on 4/10 0.449 - Thyroid ultrasound and follow-up outpatient  Barriers to discharge/continued care: Patient was traveling out of home state. Family would like patient transferred to another hospital [place]([facility], [place], [place]). Attending, Dr. Delton CoombesByrum spoke with transfer center @ [facility]. [facility] Best practice:  Diet: Thin Pain/Anxiety/Delirium protocol (if indicated): Versed PRN VAP protocol (if indicated): N/A DVT prophylaxis: SCDs GI prophylaxis: PPI Glucose control: will CTM Mobility: Bedrest, soft restraints in place Code Status: Full Code Family Communication: Updated Daughter 4/13 Disposition: Neuro ICU, [facility]  Labs   CBC: Recent Labs  Lab 12/19/19 0625 12/19/19 [id number] 12/20/19 0528 12/21/19 0248 12/22/19 0620  WBC 5.0  --  3.8* 4.4 7.4  NEUTROABS 3.3  --   --   --   --   HGB 12.6 12.2 9.1* 9.0* 11.0*  HCT 40.8 36.0 28.6* 28.7* 35.3*  MCV 77.3*  --  76.9* 79.1* 79.0*  PLT 223  --  183 183 269    Basic Metabolic Panel: Recent Labs  Lab 12/20/19 0528 12/20/19 1130 12/21/19 0248 12/21/19 1111 12/21/19 2255 12/22/19 0620 12/22/19 1114 12/22/19 1616 12/22/19 2354  NA 147*   < > 157*   < > 157* 156* 154* 149* 146*  K 3.3*  --  3.4*  --   --  2.9*  --  3.3* 3.0*  CL 126*  --  >130*  --   --  128*  --  120* 119*  CO2 14*  --  16*  --   --  16*  --  19* 15*  GLUCOSE 178*  --  141*  --   --  129*  --  115* 122*  BUN 18  --  16  --   --  18  --  16 12  CREATININE 0.93  --  0.95  --   --  1.37*  --  1.35* 1.35*  CALCIUM 9.3   --  9.8  --   --  10.4*  --  10.3 10.1  MG  --   --  1.8  --   --  1.8  --   --  2.2  PHOS  --   --   --   --   --  3.2  --   --  2.3*   < > = values in this interval not displayed.   GFR: Estimated Creatinine Clearance: 47.7 mL/min (A) (by C-G formula based on SCr of 1.35 mg/dL (H)). Recent Labs  Lab 12/19/19 0625 12/20/19 0528 12/21/19 0248 12/22/19 0620  WBC 5.0 3.8*  4.4 7.4  LATICACIDVEN 1.0  --   --   --     Liver Function Tests: Recent Labs  Lab 12/19/19 0625  AST 15  ALT 9  ALKPHOS 91  BILITOT 0.3  PROT 8.5*  ALBUMIN 4.0   No results for input(s): LIPASE, AMYLASE in the last 168 hours. Recent Labs  Lab 12/19/19 0625  AMMONIA 39*    ABG    Component Value Date/Time   PHART 7.348 (L) 12/19/2019 0635   PCO2ART 20.6 (L) 12/19/2019 0635   PO2ART 120.0 (H) 12/19/2019 0635   HCO3 11.3 (L) 12/19/2019 0635   TCO2 12 (L) 12/19/2019 0635   ACIDBASEDEF 12.0 (H) 12/19/2019 0635   O2SAT 99.0 12/19/2019 0635     Coagulation Profile: Recent Labs  Lab 12/19/19 1227 12/20/19 0528 12/21/19 0537 12/22/19 0620 12/22/19 2354  INR 1.4* 1.4* 1.4* 1.5* 1.4*    Cardiac Enzymes: Recent Labs  Lab 12/19/19 0625  CKTOTAL 181    HbA1C: No results found for: HGBA1C  CBG: Recent Labs  Lab 12/19/19 0436  GLUCAP 104*    Review of Systems:   No significant findings outside HPI and interval events.   Past Medical History  She,  has no past medical history on file.   Surgical History   History reviewed. No pertinent surgical history.   Social History   reports that she has never smoked. She has never used smokeless tobacco. She reports current alcohol use. She reports current drug use.   Family History   Her family history is not on file.   Allergies Allergies  Allergen Reactions  . Codeine Rash     Home Medications  Prior to Admission medications   Medication Sig Start Date End Date Taking? Authorizing Provider  bosentan (TRACLEER) 125 MG tablet  Take 125 mg by mouth 2 (two) times daily.   Yes [provider]  busPIRone (BUSPAR) 10 MG tablet Take 10 mg by mouth 2 (two) times daily.   Yes [provider]  cholecalciferol (VITAMIN D3) 25 MCG (1000 UNIT) tablet Take 1,000 Units by mouth daily.   Yes [provider]  dicyclomine (BENTYL) 10 MG capsule Take 10 mg by mouth 4 (four) times daily -  before meals and at bedtime.   Yes [provider]  diphenoxylate-atropine (LOMOTIL) 2.5-0.025 MG tablet Take 1 tablet by mouth 4 (four) times daily as needed for diarrhea or loose stools.   Yes [provider]  epoprostenol (VELETRI) 1.5 MG injection Inject 6,000 mcg into the vein continuous. 60,000 ng/ml in 142ml solution infused over 24 hours. ( 4 vials per cassette)   Yes [provider]  ferrous sulfate 325 (65 FE) MG tablet Take 325 mg by mouth daily with breakfast.   Yes [provider]  furosemide (LASIX) 80 MG tablet Take 80 mg by mouth 2 (two) times daily.   Yes [provider]  gabapentin (NEURONTIN) 800 MG tablet Take 800 mg by mouth 3 (three) times daily.   Yes [provider]  hydrocortisone-pramoxine (PROCTOFOAM-HC) rectal foam Place 1 applicator rectally 2 (two) times daily.   Yes [provider]  lactobacillus acidophilus (BACID) TABS tablet Take 2 tablets by mouth 2 (two) times daily.   Yes [provider]  LORazepam (ATIVAN) 1 MG tablet Take 1 mg by mouth 2 (two) times daily as needed for anxiety.   Yes [provider]  magnesium gluconate (MAGONATE) 500 MG tablet Take 500 mg by mouth 2 (two) times daily.  Yes [provider]  Multiple Vitamins-Minerals (MULTIVITAMIN WITH MINERALS) tablet Take 1 tablet by mouth daily.   Yes [provider]  OXYGEN Inhale 3 L into the lungs continuous.   Yes [provider]  potassium chloride (KLOR-CON) 10 MEQ tablet Take 40 mEq by mouth 2 (two) times daily.   Yes  [provider]  sodium bicarbonate 650 MG tablet Take 650 mg by mouth 2 (two) times daily.   Yes [provider]  spironolactone (ALDACTONE) 100 MG tablet Take 100 mg by mouth 2 (two) times daily.   Yes [provider]  tadalafil, PAH, (ADCIRCA) 20 MG tablet Take 40 mg by mouth at bedtime.   Yes [provider]  temazepam (RESTORIL) 30 MG capsule Take 30 mg by mouth at bedtime.   Yes [provider]  warfarin (COUMADIN) 10 MG tablet Take 10 mg by mouth daily.   Yes [provider]     This note was initiated by Luanna Salk. Delton See, MS4.

## 2019-12-23 NOTE — Evaluation (Signed)
Occupational Therapy Evaluation Patient Details Name: Elizabeth Shepherd MRN: [id number] DOB: 29-Nov-1955 Today's Date: 12/23/2019    History of Present Illness Pt is 64 y/o female who was brought into the emergency room following being found knocking on people's doors at a hotel. PMH includes PAH on vasodilator therapy, chronic hypoxemic respiratory failure, anticoagulation with a prior SDH (treated surgically). She has been having falls, instability, was admitted with recurrent left-sided SDH about 2 days following a fall.   Clinical Impression   Pt admitted with above diagnoses, unsure of PLOF due to pt being poor historian. At time of eval pt completing bed mobility at min A and sit <> stands at min A +2/mod A for steadying support. Pt impulsive and insistent on being independent despite safety risks. Increased cueing and time needed for basic tasks. Pt able to transfer to [facility] with mod A to have BM. Pt then stood with min-mod steadying support to complete peri hygiene. Safety cues and redirection needed at max A level throughout session. Given current status recommend SNF, but per chart review pt is being transferred to hospital closer to [place] and family. Will continue to follow acutely per POC listed below.    Follow Up Recommendations  SNF;Supervision/Assistance - 24 hour;Other (comment)(per chart review pt plan to t/f to hospital in [place])    Equipment Recommendations  Other (comment)(TBD)    Recommendations for Other Services       Precautions / Restrictions Precautions Precautions: Fall Restrictions Weight Bearing Restrictions: No      Mobility Bed Mobility Overal bed mobility: Needs Assistance Bed Mobility: Supine to Sit;Sit to Supine     Supine to sit: Min assist Sit to supine: Min assist   General bed mobility comments: min A to guide BLEs and support trunk for safety  Transfers Overall transfer level: Needs assistance Equipment used: None Transfers: Sit to/from  [employer] Sit to Stand: Min assist;+2 physical assistance;+2 safety/equipment Stand pivot transfers: Mod assist;+2 safety/equipment       General transfer comment: assist to steady self and maintain safety    Balance Overall balance assessment: Needs assistance Sitting-balance support: No upper extremity supported;Feet supported Sitting balance-Leahy Scale: Poor Sitting balance - Comments: was able to obtain brief periods of supervision, but close assist needed- min A to maintain sitting balance EOB and to maintain safety. Increased assist needed when donning socks EOB   Standing balance support: Bilateral upper extremity supported;During functional activity Standing balance-Leahy Scale: Poor Standing balance comment: pt requiring min-mod steadying support to safely maintain balance with functional transfers                           ADL either performed or assessed with clinical judgement   ADL Overall ADL's : Needs assistance/impaired Eating/Feeding: Set up;Sitting;Cueing for safety   Grooming: Set up;Sitting;Cueing for safety;Wash/dry face   Upper Body Bathing: Minimal assistance;Sitting   Lower Body Bathing: Moderate assistance;Sit to/from stand;Sitting/lateral leans   Upper Body Dressing : Minimal assistance;Sitting   Lower Body Dressing: Moderate assistance;Sit to/from stand;Sitting/lateral leans Lower Body Dressing Details (indicate cue type and reason): pt able to don L sock with increased time and cueing for safety. External assist needed to maintain sitting balance while EOB. Pt fatiguing quickly and needing assist to don R sock. Toilet Transfer: Moderate assistance;Stand-pivot;Cueing for safety;Cueing for sequencing;BSC   Toileting- Clothing Manipulation and Hygiene: Moderate assistance;Sit to/from stand Toileting - Clothing Manipulation Details (indicate cue type and reason): mod  A to steady self from assist from therapist. Pt insistent on  wiping own peri area despite fall risk this may impose     Functional mobility during ADLs: Moderate assistance;+2 for safety/equipment;Cueing for safety       Vision Patient Visual Report: No change from baseline       Perception     Praxis      Pertinent Vitals/Pain Pain Assessment: Faces Faces Pain Scale: Hurts little more Pain Location: generalized, cannot locate Pain Descriptors / Indicators: Aching;Moaning Pain Intervention(s): Monitored during session;Repositioned     Hand Dominance     Extremity/Trunk Assessment Upper Extremity Assessment Upper Extremity Assessment: Generalized weakness;Difficult to assess due to impaired cognition   Lower Extremity Assessment Lower Extremity Assessment: Generalized weakness;Difficult to assess due to impaired cognition       Communication Communication Communication: Expressive difficulties   Cognition Arousal/Alertness: Awake/alert Behavior During Therapy: Restless;Impulsive Overall Cognitive Status: No family/caregiver present to determine baseline cognitive functioning Area of Impairment: Orientation;Attention;Memory;Following commands;Safety/judgement;Awareness;Problem solving                 Orientation Level: Disoriented to;Place;Time;Situation Current Attention Level: Focused Memory: Decreased recall of precautions;Decreased short-term memory Following Commands: Follows one step commands inconsistently;Follows one step commands with increased time Safety/Judgement: Decreased awareness of safety;Decreased awareness of deficits Awareness: Intellectual Problem Solving: Slow processing;Decreased initiation;Requires verbal cues;Requires tactile cues General Comments: Pt poor historian in relation to current situation. She is overall impulsive and has little awareness to safety with BADL. Pt persistently wanting to complete all transfers and BADL without assist despite safety risk this may impose.   General  Comments       Exercises     Shoulder Instructions      Home Living Family/patient expects to be discharged to:: Unsure                                 Additional Comments: unsure of prior living situation or level of independence due to pt being a poor historian. Per chart review pt is originally from [place] and lives with husband.      Prior Functioning/Environment          Comments: At one point pt mentions using a walker at baseline, but this is not confirmed and pt is overall poor historian        OT Problem List: Decreased strength;Decreased knowledge of use of DME or AE;Decreased coordination;Decreased knowledge of precautions;Decreased activity tolerance;Decreased cognition;Impaired balance (sitting and/or standing);Decreased safety awareness      OT Treatment/Interventions: Self-care/ADL training;Therapeutic exercise;Patient/family education;Balance training;Neuromuscular education;Energy conservation;Therapeutic activities;DME and/or AE instruction;Cognitive remediation/compensation    OT Goals(Current goals can be found in the care plan section) Acute Rehab OT Goals Patient Stated Goal: be independent OT Goal Formulation: With patient Time For Goal Achievement: 01/06/20 Potential to Achieve Goals: Good  OT Frequency: Min 2X/week   Barriers to D/C:            Co-evaluation PT/OT/SLP Co-Evaluation/Treatment: Yes Reason for Co-Treatment: Necessary to address cognition/behavior during functional activity;For patient/therapist safety;To address functional/ADL transfers   OT goals addressed during session: ADL's and self-care;Proper use of Adaptive equipment and DME      AM-PAC OT "6 Clicks" Daily Activity     Outcome Measure Help from another person eating meals?: A Little Help from another person taking care of personal grooming?: A Little Help from another person toileting, which includes using toliet, bedpan, or urinal?: A  Lot Help from another  person bathing (including washing, rinsing, drying)?: A Lot Help from another person to put on and taking off regular upper body clothing?: A Little Help from another person to put on and taking off regular lower body clothing?: A Lot 6 Click Score: 15   End of Session Nurse Communication: Mobility status  Activity Tolerance: Patient tolerated treatment well Patient left: in bed;with call bell/phone within reach;with bed alarm set;with restraints reapplied  OT Visit Diagnosis: Unsteadiness on feet (R26.81);Other abnormalities of gait and mobility (R26.89);Muscle weakness (generalized) (M62.81);History of falling (Z91.81);Other symptoms and signs involving the nervous system (R29.898);Other symptoms and signs involving cognitive function                Time: 1430-1500 OT Time Calculation (min): 30 min Charges:  OT General Charges $OT Visit: 1 Visit OT Evaluation $OT Eval Moderate Complexity: 1 Mod  Dalphine Handing, MSOT, OTR/L Acute Rehabilitation Services [facility] Office Number: [phone] Pager: [phone]  Dalphine Handing 12/23/2019, 5:52 PM

## 2019-12-23 NOTE — Progress Notes (Signed)
NAME:  Elizabeth Shepherd, MRN:  [id number], DOB:  Dec 20, 1955, LOS: 4 ADMISSION DATE:  12/19/2019 REFERRING MD:  Renae Fickle Physician, CHIEF COMPLAINT: Altered mental status  Brief History   Patient was brought into the emergency room following being found knocking on people's doors at a hotel According to daughter, patient fell about 2 days ago, was slow to get up but was able to get up unable to move around Did not exhibit any weakness on any side, was slow but appropriate Patient was wandering around and knocking on people's doors was why she was brought into the hospital early this morning She had had a subdural bleed in the past about 3 years ago managed at [facility] Patient primarily resides in [place] Herself and her daughter were on a trip to go to [place] to see pts physician who was managing her pulmonary hypertension Treated for C. difficile in December 2020  On an infusion of Veletri  Past Medical History  History of subdural hemorrhage History of pulmonary hypertension Ataxia Chronic pain syndrome Chronic respiratory failure on home oxygen Reformed smoker Depression Hypercoagulable state-history of prothrombin mutation Atrial fibrillation Chronic metabolic acidosis  Significant Hospital Events   Required multiple doses of medications to sedate this morning as she was very agitated-ketamine, Geodon, benzodiazepines  Consults:  Neurosurgery Neurology  Procedures:  None   Significant Diagnostic Tests:  CT Head 12/20/19@11 :09  IMPRESSION: 1. Moderate acute subdural hematoma over the left convexity most prominent over the temporal region with there is focal bulbous prominence of this hematoma measuring 2 cm in thickness. This subdural hematoma appears to communicate with an area of acute parenchymal hemorrhage over the left temporal lobe measuring 2.2 x 3.1 cm. There is minimal associated acute subarachnoid hemorrhage over the left temporal region. Small amount of  acute subdural hemorrhage over the anterior interhemispheric fissure as well as left tentorium. Local mass effect over the left convexity and mild midline shift to the right of 5-6 mm.  2. 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 Postsurgical defect over the left frontal parietal skull.  3.  No acute facial bone fracture.  4.  No acute cervical spine injury.  5. Enlarged heterogeneous thyroid likely goiter. Recommend thyroid ultrasound (ref: J Am Coll Radiol. 2015 Feb;12(2): 143-50).  CT Head 12/19/19 @15 :29 IMPRESSION: 1. Stable extensive left side subdural hematoma, lobulated and ranging from 7 mm to 20 mm in thickness. 2. Stable left temporal lobe hemorrhagic contusion. Stable small volume left para falcine subdural blood. 3. Stable intracranial mass effect with rightward midline shift at 7 mm. 4. No new intracranial abnormality. 5. Mild bilateral mastoid effusions. No acute skull fracture Identified. CT head 4/12 > unchanged left temporal lobe contusion and left cerebral SDH. CT of the head 12/22/2018 with no significant change 12/22/2018 one 2D echo which basically shows normal LV function right and left Micro Data:  SARS coronavirus negative Influenza A and B negative  Antimicrobials:  None  Interim history/subjective:  Off Precedex. Currently on room air. Currently in sinus rhythm. Somewhat lethargic but hemodynamically stable. Questionable transfer to [facility] per request of family and long-term relationship with pulmonologist in [place].  Objective   Blood pressure (!) 86/57, pulse 98, temperature (!) 97.5 F (36.4 C), temperature source Axillary, resp. rate 18, height 5\' 8"  (1.727 m), weight 76.8 kg, SpO2 99 %.        Intake/Output Summary (Last 24 hours) at 12/23/2019 [phone]  Last data filed at 12/23/2019 0600 Gross per 24 hour  Intake 626.84 ml  Output 1000 ml    Net -373.16 ml   Filed Weights   12/19/19 0422 12/22/19 0500 12/23/19 0500  Weight: 77.1 kg 77.2 kg 76.8 kg   Physical Exam: General: Elderly female who arouses to voice but is somewhat stunned. HEENT: Pupils equal reactive to light. Dry oral mucosa. Able to follow commands Neuro: Lethargic appearing. Able to follow some commands. Questionable right-sided weakness. CV: Heart sounds regular regular rate and rhythm currently sinus rhythm PULM: Currently on no supplemental oxygen. O2 saturations 99% on room air. Good air movement  GI: soft, bsx4 active   Extremities: warm/dry,  edema, old scars Skin: no rashes or lesions    Assessment & Plan:   Altered mental status - UDS + benzos otherwise tox work-up negative..  Presumed due to acute SDH Acute left subdural hematoma in setting of anticoagulation, fall- S/p Kcentral and Vitamin K. INR normalized.  Repeat CT head stable 4/12   12/22/2019 CT of the head shows no significant change in findings Appreciate neurology and neurosurgery evaluations without indication for surgical intervention at this time. Continue analeptics Hypertonic saline is on hold Maintain systolic blood pressure less than 140 Again timing of anticoagulation remains unclear in the setting of her pulmonary hypertension with factor II mutation and history of atrial fibrillation  Possibly acute on chronic subdural hematoma Appreciate neurology and neurosurgery evaluation, surgical treatment not recommended at this point.  Following for clinical improvement Continue Keppra, valproic acid per neurology recommendations Hypertonic saline held, sodium remains at goal 156 SBP goal less than 140 Anticoagulation on hold, unclear whether she will be a candidate to have this restarted.  Will have to the risk benefits given her factor II mutation, A. fib, pulmonary hypertension.  Coagulopathy due to medication- resolved. Lab Results  Component Value Date   INR 1.4 (H)  12/22/2019   INR 1.5 (H) 12/22/2019   INR 1.4 (H) 12/21/2019    Anticoagulation is on hold. Timing of resumption of anticoagulation pending on neurological findings.  Chronic respiratory failure - On 2 to 3 L of oxygen. Titrate oxygen to keep sats greater than 88%  Idiopathic pulmonary arterial hypertension -has been managed in [place], plan was in place to transition management to physician in [place].  Continue bosentan,tadalafil , epoprostenol currently Diuretics are on hold until creatinine is available 12/22/2018 one 2D echo reveals normal right ventricular function   Hypokalemia, hypomagnesemia repleted on 12/22/2019 Recent Labs  Lab 12/22/19 0620 12/22/19 1616 12/22/19 2354  K 2.9* 3.3* 3.0*  Check basic metabolic package for 7/78/2423 and treat as indicated   Acute on Chronic kidney disease Lab Results  Component Value Date   CREATININE 1.35 (H) 12/22/2019   CREATININE 1.35 (H) 12/22/2019   CREATININE 1.37 (H) 53/61/4431  Basic metabolic package has been ordered for 12/23/2019 interventions depending on results. Continue to hold Lasix Avoid nephrotoxins      History of depression, ataxia, chronic pain. Continue supportive care Hold BuSpar at this time    Best practice:  Diet: Dysphagia 2 Pain/Anxiety/Delirium protocol (if indicated): Precedex VAP protocol (if indicated): Not applicable DVT prophylaxis: SCD timing of resumption of anticoagulation pending GI prophylaxis: PPI Glucose control: Will monitor Mobility: Bedrest Code Status: Full code Family Communication:  Disposition: Neuro ICU  App  CC time 30 minutes  Richardson Landry Madisynn Plair ACNP Acute Care Nurse Practitioner [place] Please consult Amion 12/23/2019, 7:48 AM

## 2019-12-23 NOTE — Progress Notes (Signed)
eLink Physician-Brief Progress Note Patient Name: Elizabeth Shepherd DOB: 02/03/1956 MRN: [id number]   Date of Service  12/23/2019  HPI/Events of Note  Agitation - Patient trying to get OOB. Request for Posey belt restraint.   eICU Interventions  Will order: 1. Posey Belt restraint X 4 hours.      Intervention Category Major Interventions: Delirium, psychosis, severe agitation - evaluation and management  Carynn Felling [place] 12/23/2019, 4:01 AM

## 2019-12-23 NOTE — Progress Notes (Signed)
Subjective: Patient reports no headache  Objective: Vital signs in last 24 hours: Temp:  [97.5 F (36.4 C)-98 F (36.7 C)] 97.5 F (36.4 C) (04/14 0000) Pulse Rate:  [88-120] 98 (04/14 0600) Resp:  [16-26] 18 (04/14 0600) BP: (80-118)/(51-99) 86/57 (04/14 0600) SpO2:  [95 %-100 %] 99 % (04/14 0600) Weight:  [76.8 kg] 76.8 kg (04/14 0500)  Intake/Output from previous day: 04/13 0701 - 04/14 0700 In: 626.8 [P.O.:360; IV Piggyback:266.8] Out: 1000 [Urine:1000] Intake/Output this shift: No intake/output data recorded.  Eyes open to voice but sleepy R facial droop, R pronator drift FC x 4 Oriented to person, hospital Speech dysarthric.  Lab Results: Recent Labs    12/21/19 0248 12/22/19 0620  WBC 4.4 7.4  HGB 9.0* 11.0*  HCT 28.7* 35.3*  PLT 183 269   BMET Recent Labs    12/22/19 2354 12/23/19 0648  NA 146* 147*  K 3.0* 3.4*  CL 119* 118*  CO2 15* 18*  GLUCOSE 122* 95  BUN 12 13  CREATININE 1.35* 1.32*  CALCIUM 10.1 10.1    Studies/Results: CT HEAD WO CONTRAST  Result Date: 12/23/2019 CLINICAL DATA:  Stroke follow-up EXAM: CT HEAD WITHOUT CONTRAST TECHNIQUE: Contiguous axial images were obtained from the base of the skull through the vertex without intravenous contrast. COMPARISON:  Head CT 12/21/2019 FINDINGS: Brain: Large left hemisphere mixed intra- and extra-axial hemorrhage is unchanged. The left temporal intra-axial component again measures 3.0 cm in AP dimension. There is 5 mm of rightward midline shift. Unchanged edema within the left temporal white matter. No hydrocephalus. No new site of hemorrhage. Persistent small amount blood along the anterior falx cerebri. Vascular: No abnormal hyperdensity of the major intracranial arteries or dural venous sinuses. No intracranial atherosclerosis. Skull: The visualized skull base, calvarium and extracranial soft tissues are normal. Sinuses/Orbits: No fluid levels or advanced mucosal thickening of the visualized  paranasal sinuses. No mastoid or middle ear effusion. The orbits are normal. IMPRESSION: 1. Unchanged size of large left hemisphere mixed intra- and extra-axial hemorrhage with 5 mm of rightward midline shift. 2. No new site of hemorrhage. Electronically Signed   By: Ulyses Jarred M.D.   On: 12/23/2019 03:24   ECHOCARDIOGRAM COMPLETE  Result Date: 12/22/2019    ECHOCARDIOGRAM LIMITED REPORT   Patient Name:   Elizabeth Shepherd Date of Exam: 12/22/2019 Medical Rec #:  [id number]       Height:       68.0 in Accession #:    [id number]      Weight:       170.2 lb Date of Birth:  May 18, 1956       BSA:          1.908 m Patient Age:    64 years        BP:           92/61 mmHg Patient Gender: F               HR:           98 bpm. Exam Location:  Inpatient Procedure: 2D Echo Indications:    Congestive Heart Failure I50.9  History:        Patient has no prior history of Echocardiogram examinations.  Sonographer:    Mikki Santee RDCS (AE) Referring Phys: [place]  1. Left ventricular ejection fraction, by estimation, is 60 to 65%. The left ventricle has normal function. The left ventricle has no regional wall motion abnormalities. Left ventricular diastolic  parameters were normal.  2. Right ventricular systolic function is normal. The right ventricular size is normal. There is normal pulmonary artery systolic pressure.  3. Left atrial size was mildly dilated.  4. The mitral valve is normal in structure. No evidence of mitral valve regurgitation. No evidence of mitral stenosis.  5. The aortic valve is normal in structure. Aortic valve regurgitation is not visualized. No aortic stenosis is present.  6. The inferior vena cava is normal in size with greater than 50% respiratory variability, suggesting right atrial pressure of 3 mmHg. FINDINGS  Left Ventricle: Left ventricular ejection fraction, by estimation, is 60 to 65%. The left ventricle has normal function. The left ventricle has no regional wall  motion abnormalities. The left ventricular internal cavity size was normal in size. There is  no left ventricular hypertrophy. Normal left ventricular filling pressure. Right Ventricle: The right ventricular size is normal. No increase in right ventricular wall thickness. Right ventricular systolic function is normal. There is normal pulmonary artery systolic pressure. The tricuspid regurgitant velocity is 2.24 m/s, and  with an assumed right atrial pressure of 3 mmHg, the estimated right ventricular systolic pressure is 86.7 mmHg. Left Atrium: Left atrial size was mildly dilated. Right Atrium: Right atrial size was normal in size. Pericardium: There is no evidence of pericardial effusion. Mitral Valve: The mitral valve is normal in structure. Normal mobility of the mitral valve leaflets. No evidence of mitral valve stenosis. Tricuspid Valve: The tricuspid valve is normal in structure. Tricuspid valve regurgitation is trivial. No evidence of tricuspid stenosis. Aortic Valve: The aortic valve is normal in structure. Aortic valve regurgitation is not visualized. No aortic stenosis is present. Pulmonic Valve: The pulmonic valve was normal in structure. Pulmonic valve regurgitation is not visualized. No evidence of pulmonic stenosis. Aorta: The aortic root is normal in size and structure. Venous: The inferior vena cava is normal in size with greater than 50% respiratory variability, suggesting right atrial pressure of 3 mmHg. IAS/Shunts: No atrial level shunt detected by color flow Doppler.  LEFT VENTRICLE PLAX 2D LVIDd:         5.37 cm  Diastology LVIDs:         3.26 cm  LV e' lateral:   10.40 cm/s LV PW:         0.93 cm  LV E/e' lateral: 4.1 LV IVS:        0.79 cm  LV e' medial:    6.53 cm/s LVOT diam:     2.30 cm  LV E/e' medial:  6.6 LV SV:         59 LV SV Index:   31 LVOT Area:     4.15 cm  RIGHT VENTRICLE RV S prime:     16.60 cm/s TAPSE (M-mode): 1.6 cm LEFT ATRIUM             Index       RIGHT ATRIUM            Index LA diam:        3.80 cm 1.99 cm/m  RA Area:     13.70 cm LA Vol (A2C):   32.8 ml 17.19 ml/m RA Volume:   31.10 ml  16.30 ml/m LA Vol (A4C):   30.6 ml 16.04 ml/m LA Biplane Vol: 35.1 ml 18.39 ml/m  AORTIC VALVE LVOT Vmax:   85.80 cm/s LVOT Vmean:  52.300 cm/s LVOT VTI:    0.142 m  AORTA Ao Root diam: 3.40 cm MITRAL VALVE  TRICUSPID VALVE MV Area (PHT): 2.87 cm    TR Peak grad:   20.1 mmHg MV Decel Time: 264 msec    TR Vmax:        224.00 cm/s MV E velocity: 42.80 cm/s MV A velocity: 49.40 cm/s  SHUNTS MV E/A ratio:  0.87        Systemic VTI:  0.14 m                            Systemic Diam: 2.30 cm Mihai Croitoru MD Electronically signed by Sanda Klein MD Signature Date/Time: 12/22/2019/12:39:00 PM    Final     Assessment/Plan: 64 yo F with severe pulm artery HTN, prothrombotic state who suffered a left temporal lobe ICH with SDH extension. - would not recommend craniotomy at this time as risks outweigh benefits but she may later be a candidate for bur hole drainage in the subacute setting in ~2-4 weeks if significant SDH persists--this can potentially be done under local with MAC rather than general anesthesia. - resumption of anticoagulation will be tricky given her significant ICH.  Generally I would recommend waiting at least 2 weeks before resumption, but if she has very elevated risk of thrombosis, can consider starting heparin gtt 1 wk post-bleed and getting interval CT when PTT therapeutic. - given the medical complexity of her case, would agree with transfer back to [facility]- she appears to be stable neurologically for transfer   Vallarie Mare 12/23/2019, 8:41 AM

## 2019-12-23 NOTE — Progress Notes (Signed)
Cassette changed tonight at 1930 with daughter and rapid response at bedside - expiration date and time was verified prior to administration.    Harland German, PharmD Clinical Pharmacist **Pharmacist phone directory can now be found on [email or web address] ([email or web address]).  Listed under [facility].

## 2019-12-23 NOTE — TOC Initial Note (Addendum)
Transition of Care [facility]) - Initial/Assessment Note    Patient Details  Name: Elizabeth Shepherd MRN: [id number] Date of Birth: 05/09/1956  Transition of Care [facility]) CM/SW Contact:    Glennon Mac, RN Phone Number: 12/23/2019, 4:07 PM Clinical Narrative:  64 year old woman with history of PAH on vasodilator therapy, chronic hypoxemic respiratory failure, anticoagulation with a prior SDH (treated surgically).  Has been having falls, instability, was admitted with recurrent left-sided SDH about 2 days following a fall.   PTA, pt resided in [place] with her husband.  She was in [place] with her daughter on her way to [place] to see a physician regarding a second opinion for her pulmonary hypertension.  Family requesting transfer to [facility] in [place], and per report, they have accepted her.  CM referral to arrange medical flight transport.  I have faxed referral to [employer] in [place] at [phone].  Per [employer], insurance auth can take 24-72 business hours.  Dr. Delton Coombes has been contacted to prepare Letter of Medical Necessity to assist with insurance authorization.    I have spoken with pt's daughter, Elizabeth Shepherd, her husband, and sister, and updated them on transport information.  Family cannot pay out of pocket for flight and wishes to wait on insurance auth.  They also state they will have patient's attending MD/pulmonologist in [place] call insurance company to assist with approval.  Will provide updates as available.                   Expected Discharge Plan: Acute to Acute Transfer Barriers to Discharge: Insurance Authorization(flight)   Patient Goals and CMS Choice        Expected Discharge Plan and Services Expected Discharge Plan: Acute to Acute Transfer   Discharge Planning Services: CM Consult   Living arrangements for the past 2 months: Single Family Home                                      Prior Living Arrangements/Services Living  arrangements for the past 2 months: Single Family Home Lives with:: Spouse Patient language and need for interpreter reviewed:: Yes Do you feel safe going back to the place where you live?: Yes      Need for Family Participation in Patient Care: Yes (Comment) Care giver support system in place?: Yes (comment)      Activities of Daily Living      Permission Sought/Granted   Permission granted to share information with : Yes, Verbal Permission Granted     Permission granted to share info w AGENCY: [facility]        Emotional Assessment              Admission diagnosis:  Subdural hematoma (HCC) [S06.5X9A] Metabolic acidosis [E87.2] Aggressive behavior [R46.89] Encephalopathy [G93.40] Intracranial bleed (HCC) [I62.9] Patient Active Problem List   Diagnosis Date Noted  . Aggressive behavior   . Encephalopathy   . Intracranial bleed (HCC)   . Subdural hematoma (HCC) 12/19/2019   PCP:  Patient, No Pcp Per Pharmacy:   [id number] Susann Givens, [place] - [address] AT [facility] [place] & [address] [place] [address] Phone: [phone] Fax: [phone]     Social Determinants of Health (SDOH) Interventions    Readmission Risk Interventions No flowsheet data found.  Quintella Baton, RN, BSN  Trauma/Neuro  ICU Case Manager [phone]

## 2019-12-24 ENCOUNTER — Inpatient Hospital Stay (HOSPITAL_COMMUNITY): Payer: Medicare HMO

## 2019-12-24 ENCOUNTER — Encounter: Payer: Self-pay | Admitting: Emergency Medicine

## 2019-12-24 DIAGNOSIS — S065X9A Traumatic subdural hemorrhage with loss of consciousness of unspecified duration, initial encounter: Secondary | ICD-10-CM | POA: Diagnosis not present

## 2019-12-24 DIAGNOSIS — G9340 Encephalopathy, unspecified: Secondary | ICD-10-CM | POA: Diagnosis not present

## 2019-12-24 LAB — BASIC METABOLIC PANEL
Anion gap: 9 (ref 5–15)
Anion gap: 11 (ref 5–15)
BUN: 9 mg/dL (ref 8–23)
BUN: 8 mg/dL (ref 8–23)
CO2: 20 mmol/L — ABNORMAL LOW (ref 22–32)
CO2: 22 mmol/L (ref 22–32)
Calcium: 9.9 mg/dL (ref 8.9–10.3)
Calcium: 9.8 mg/dL (ref 8.9–10.3)
Chloride: 117 mmol/L — ABNORMAL HIGH (ref 98–111)
Chloride: 116 mmol/L — ABNORMAL HIGH (ref 98–111)
Creatinine, Ser: 1.22 mg/dL — ABNORMAL HIGH (ref 0.44–1.00)
Creatinine, Ser: 1.08 mg/dL — ABNORMAL HIGH (ref 0.44–1.00)
GFR calc Af Amer: 55 mL/min — ABNORMAL LOW (ref >60)
GFR calc Af Amer: >60 mL/min (ref >60)
GFR calc non Af Amer: 48 mL/min — ABNORMAL LOW (ref >60)
GFR calc non Af Amer: 55 mL/min — ABNORMAL LOW (ref >60)
Glucose, Bld: 118 mg/dL — ABNORMAL HIGH (ref 70–99)
Glucose, Bld: 111 mg/dL — ABNORMAL HIGH (ref 70–99)
Potassium: 3.9 mmol/L (ref 3.5–5.1)
Potassium: 3.6 mmol/L (ref 3.5–5.1)
Sodium: 146 mmol/L — ABNORMAL HIGH (ref 135–145)
Sodium: 149 mmol/L — ABNORMAL HIGH (ref 135–145)

## 2019-12-24 LAB — PHOSPHORUS
Phosphorus: 1.8 mg/dL — ABNORMAL LOW (ref 2.5–4.6)
Phosphorus: 2.1 mg/dL — ABNORMAL LOW (ref 2.5–4.6)

## 2019-12-24 LAB — MAGNESIUM
Magnesium: 2.2 mg/dL (ref 1.7–2.4)
Magnesium: 2.3 mg/dL (ref 1.7–2.4)

## 2019-12-24 MED ORDER — K PHOS MONO-SOD PHOS DI & MONO 155-852-130 MG PO TABS
500.0000 mg | ORAL_TABLET | Freq: Three times a day (TID) | ORAL | Status: AC
  Administered 2019-12-24 (×2): 500 mg via ORAL
  Filled 2019-12-24 (×3): qty 2

## 2019-12-24 NOTE — Progress Notes (Signed)
Subjective: Patient reports no headache  Objective: Vital signs in last 24 hours: Temp:  [97.2 F (36.2 C)-98.4 F (36.9 C)] 98.1 F (36.7 C) (04/15 0800) Pulse Rate:  [46-123] 118 (04/15 0800) Resp:  [15-28] 18 (04/15 0800) BP: (93-112)/(53-89) 99/70 (04/15 0800) SpO2:  [90 %-100 %] 97 % (04/15 0800) Weight:  [76.8 kg] 76.8 kg (04/15 0500)  Intake/Output from previous day: 04/14 0701 - 04/15 0700 In: 200 [IV Piggyback:200] Out: 950 [Urine:950] Intake/Output this shift: No intake/output data recorded.  Somewhat more sleepy today.  Eyes open to voice, following only simple commands x 4, some pronator drift on right.  Speech nonsensical.  Lab Results: Recent Labs    12/22/19 0620 12/23/19 2317  WBC 7.4 5.6  HGB 11.0* 10.2*  HCT 35.3* 31.6*  PLT 269 252   BMET Recent Labs    12/23/19 0836 12/23/19 1240 12/23/19 1622 12/23/19 2317  NA 146*   < > 149* 146*  K 3.2*  --   --  3.9  CL 118*  --   --  117*  CO2 20*  --   --  20*  GLUCOSE 99  --   --  118*  BUN 13  --   --  9  CREATININE 1.22*  --   --  1.22*  CALCIUM 10.0  --   --  9.9   < > = values in this interval not displayed.    Studies/Results: CT HEAD WO CONTRAST  Result Date: 12/23/2019 CLINICAL DATA:  Stroke follow-up EXAM: CT HEAD WITHOUT CONTRAST TECHNIQUE: Contiguous axial images were obtained from the base of the skull through the vertex without intravenous contrast. COMPARISON:  Head CT 12/21/2019 FINDINGS: Brain: Large left hemisphere mixed intra- and extra-axial hemorrhage is unchanged. The left temporal intra-axial component again measures 3.0 cm in AP dimension. There is 5 mm of rightward midline shift. Unchanged edema within the left temporal white matter. No hydrocephalus. No new site of hemorrhage. Persistent small amount blood along the anterior falx cerebri. Vascular: No abnormal hyperdensity of the major intracranial arteries or dural venous sinuses. No intracranial atherosclerosis. Skull: The  visualized skull base, calvarium and extracranial soft tissues are normal. Sinuses/Orbits: No fluid levels or advanced mucosal thickening of the visualized paranasal sinuses. No mastoid or middle ear effusion. The orbits are normal. IMPRESSION: 1. Unchanged size of large left hemisphere mixed intra- and extra-axial hemorrhage with 5 mm of rightward midline shift. 2. No new site of hemorrhage. Electronically Signed   By: Deatra Robinson M.D.   On: 12/23/2019 03:24   DG Chest Port 1 View  Result Date: 12/24/2019 CLINICAL DATA:  Abnormal respirations.  Subdural hematoma. EXAM: PORTABLE CHEST 1 VIEW COMPARISON:  Chest x-ray 12/19/2019. FINDINGS: Patient is rotated. PowerPort catheter stable position. Mediastinum and hilar structures appear unremarkable. Previously identified nodular opacity noted over the right hilum no longer visualized. This is most likely vascular. Heart size stable. No focal infiltrate. No pleural effusion or pneumothorax. IMPRESSION: 1.  PowerPort catheter stable position. 2. No acute cardiopulmonary disease. Previously identified nodular opacity over the right hilum no longer identified. This was most likely vascular. Electronically Signed   By: Maisie Fus  Register   On: 12/24/2019 06:43    Assessment/Plan: 64 yo F with severe pulm HTN with left temporal lobe hematoma, medium sized SDH.  She has had some waxing-waning of her neurologic status.  Current plan is likely transfer to [facility].  If restarting anticoagulation is being considered, would recommend waiting at least one  week from her hemorrhage and starting heparin gtt with no bolus, with CT obtained when therapeutic.   Vallarie Mare 12/24/2019, 12:01 PM

## 2019-12-24 NOTE — Progress Notes (Signed)
Pt refusing lab draws and po meds. Dr. Delton Coombes text paged and made aware.

## 2019-12-24 NOTE — TOC Progression Note (Addendum)
Transition of Care [facility]) - Progression Note    Patient Details  Name: Elizabeth Shepherd MRN: [id number] Date of Birth: 11-21-1955  Transition of Care [facility]) CM/SW Contact  Astrid Drafts Berna Spare, RN Phone Number: 12/24/2019, 4:39 PM  Clinical Narrative:  Letter of Medical Necessity faxed to [employer], per request, in an attempt to obtain insurance authorization for medical flight to [facility].     Expected Discharge Plan: Acute to Acute Transfer Barriers to Discharge: Insurance Authorization(flight)  Expected Discharge Plan and Services Expected Discharge Plan: Acute to Acute Transfer   Discharge Planning Services: CM Consult   Living arrangements for the past 2 months: Single Family Home                                       Social Determinants of Health (SDOH) Interventions    Readmission Risk Interventions No flowsheet data found.  Quintella Baton, RN, BSN  Trauma/Neuro ICU Case Manager [phone]

## 2019-12-24 NOTE — Care Plan (Signed)
Condition improved in ICU transferring to stepdown unit.   Patient originally from [place] and was traveling to [place] noted to have AMS and brought into [facility] by EMS. Family would like patient transferred back to [facility] in [place], [place].   Family cannot cover cost for transfer. Awaiting insurance authorization.   Care Management involved:  Quintella Baton, RN, BSN  Trauma/Neuro ICU Case Manager [phone]  [facility] transfer center contact:  [phone]  Patient follows with [facility] Pulmonologist:  Dorothe Pea, MD [phone]  Family contact:  Spouse - Fransisco Beau: [phone] Daughter - [name]: [phone]

## 2019-12-24 NOTE — Progress Notes (Signed)
NAME:  Elizabeth Shepherd, MRN:  [id number], DOB:  1956-01-02, LOS: 5 ADMISSION DATE:  12/19/2019, CONSULTATION DATE:  12/19/2019 REFERRING MD: Baxter Hire Ward, DO , CHIEF COMPLAINT: Altered Mental Status  Brief History   Elizabeth Shepherd is a [name] with history of SDH s/p Burr hole in 2016, pulm HTN - on veletri infusion, Chronic respiratory failure on home oxygen, prothrombin mutation/ stable atrial fibrillation - on wafarin, chronic pain syndrome and C.diff in dec 2020 - on vancomycin who presents with altered mental status 2/2 SDH.   History of present illness   64 y.o. female with aforementioned hhx who her daughter reports fell 3 times several weeks ago. Daughter was made mention of the frequent falls after the last event when her step-father was not able to pick up patient from the floor. LOC during events is unknown. These episode of fall occurred in the past (2016) when the patient fell in the bathroom at a doctor's visit, hitting her head and suffering a SDH s/p burr hole. Her condition has been stable since to the point that she was placed on coumadin for her afib. Initially after the most recent events patient was found to be "ok" after falls and continued with her ADLs. During the week of 4/2 her daughter noticed some small changes in behavior with patient writhing hands in fabric and behaving as if she had "dementia". Patient and daughter were set to travel from their home in [place] to [place] to consult with a pulmonologist regarding a second opinion regarding her pulm HTN infusion, d/t infections from picc line. They stopped at a hotel and during the night the patient left the room and started knocking on the doors at which time EMS was called and patient was transported to [facility] ED.   On arrival patient was agitated, confused and aggressive, to the point that staff were threatened. ED administered Haldol, Geodon, Ativan and ketamine with little improvement and transitioned to a precedex infusion with IV  Versed.   CT scan w/o contrast revealed an intracranial bleed with rightward 7 mm midline shift and NSG and neurology were consulted. INR on arrival was 5.6. Patient was not intubated and transferred to 4N ICU.   Past Medical History  History of subdural hemorrhage History of pulmonary hypertension Ataxia Chronic pain syndrome Chronic respiratory failure on home oxygen Reformed smoker Depression Hypercoagulable state-history of prothrombin mutation Atrial fibrillation Chronic metabolic acidosis  Significant Hospital Events   Required multiple doses of medications to sedate this morning as she was very agitated-ketamine, Geodon, benzodiazepines, precedex infusion, versed - now improved.   Consults:  Neurosurgery Neurology, signed-off  Procedures:  2D ECHO  Significant Diagnostic Tests:  Chest XR 4/15@06 :43:   IMPRESSION: 1.  PowerPort catheter stable position.  2. No acute cardiopulmonary disease. Previously identified nodular opacity over the right hilum no longer identified. This was most likely vascular.  CT Head 12/23/19@03 :24  IMPRESSION: 1. Unchanged size of large left hemisphere mixed intra- and extra-axial hemorrhage with 5 mm of rightward midline shift. 2. No new site of hemorrhage.  2D ECHO 12/22/19@12 :40  IMPRESSIONS  1. Left ventricular ejection fraction, by estimation, is 60 to 65%. The  left ventricle has normal function. The left ventricle has no regional  wall motion abnormalities. Left ventricular diastolic parameters were  normal.  2. Right ventricular systolic function is normal. The right ventricular  size is normal. There is normal pulmonary artery systolic pressure.  3. Left atrial size was mildly dilated.  4.  The mitral valve is normal in structure. No evidence of mitral valve  regurgitation. No evidence of mitral stenosis.  5. The aortic valve is normal in structure. Aortic valve regurgitation is  not visualized. No aortic stenosis  is present.  6. The inferior vena cava is normal in size with greater than 50%  respiratory variability, suggesting right atrial pressure of 3 mmHg.   CT Head 12/20/19@11 :09  IMPRESSION: 1. Moderate acute subdural hematoma over the left convexity most prominent over the temporal region with there is focal bulbous prominence of this hematoma measuring 2 cm in thickness. This subdural hematoma appears to communicate with an area of acute parenchymal hemorrhage over the left temporal lobe measuring 2.2 x 3.1 cm. There is minimal associated acute subarachnoid hemorrhage over the left temporal region. Small amount of acute subdural hemorrhage over the anterior interhemispheric fissure as well as left tentorium. Local mass effect over the left convexity and mild midline shift to the right of 5-6 mm.  2. 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 Postsurgical defect over the left frontal parietal skull.  3. No acute facial bone fracture.  4. No acute cervical spine injury.  5. Enlarged heterogeneous thyroid likely goiter. Recommend thyroid ultrasound (ref: J Am Coll Radiol. 2015 Feb;12(2): 143-50).  CT Head 12/19/19 @15 :29 IMPRESSION: 1. Stable extensive left side subdural hematoma, lobulated and ranging from 7 mm to 20 mm in thickness. 2. Stable left temporal lobe hemorrhagic contusion. Stable small volume left para falcine subdural blood. 3. Stable intracranial mass effect with rightward midline shift at 7 mm. 4. No new intracranial abnormality. 5. Mild bilateral mastoid effusions. No acute skull fracture Identified. CT head 4/12 > unchanged left temporal lobe contusion and left cerebral SDH. Micro Data:  SARS coronavirus negative Influenza A and B negative C.diff negative  Antimicrobials:  None   Interim history/subjective:  4/12 pm --> aggressive and agitated with staff. Was placed back  on precedex infusion and required 2 mg versed.  4/13 am --> No acute events overnight 4/13 pm --> Maintaining adequate SpO2 on RA 4/14 am--> Did attempt to get OOB, posey restraints reordered. Required 1mg  of versed, otherwise no acute events overnight  4/14 pm --> No acute events overnight.  4/15 am -->Repeat Chest xr reveals previous nodular opacity at right hilum no longer identified. Desat to 83% now back on 2L [place] satting 97%  Objective   Blood pressure 105/62, pulse (!) 120, temperature 98.2 F (36.8 C), temperature source Axillary, resp. rate (!) 22, height 5\' 8"  (1.727 m), weight 76.8 kg, SpO2 93 %.        Intake/Output Summary (Last 24 hours) at 12/24/2019 0719 Last data filed at 12/24/2019 0600 Gross per 24 hour  Intake 200 ml  Output 950 ml  Net -750 ml   Filed Weights   12/22/19 0500 12/23/19 0500 12/24/19 0500  Weight: 77.2 kg 76.8 kg 76.8 kg    Examination: General: Elderly woman lying in bed in NAD HENT: [place]/AT, EOMI, MMM, Neck soft and supple. Symmetrical facial flushing. Lungs: CTAB, NWOB on 2L [place] Cardiovascular: ectopic beats, S1/S2 heart sounds. Abdomen: Soft ND/NT  Extremities: WWP, upper extremities 4/5 strength, lower 5/5, moves extremities independently  Neuro: Speech fluent. Follows simple commands.    Resolved Hospital Problem list   N/A  Assessment & Plan:  AMS 2/2 Acute left SDH in the setting of anticoagulation:  - Continue Keppra/Valproic acid - Continue Gabapentin 400 mg PO  TID  - Versed PRN - Soft restraints in place - NSG, appreciate recs - Neurology signed-off - No surgery at this time, considering transferring back to [place] for extending recovery once stable w/NSG/Neuro f/u in [place] - Per NSG, 2 wks for resumption of anticoagulation, but if elevated risk of thrombosis consider heparin gtt 1 wk post bleed with interval CT when PTT in therapeutic range - Holding Coags - Goal SBP <140 - Overall ICU status improved, transitioning to stepdown.    Coagulopathy: Initial INR 5.6, continues to be stable at 1.4 - Continuing to hold Wafarin   Pulmonary arterial HTN: long-standing h/o pulmonary hypertension with close follow-up in [place], seeking 2nd opinion from initial provider in [place]. No significant findings on 2D echo. - Continue Bosentan (Tracleer) 125 mg PO BID - Tadalafil (Alyq/Advirca) 40 mg daily at bedtime - Epoprostenol (Veletri) 60,000 ng/mL (100 mL) - running at 94 mL/day - Hold home Lasix - Continue Spironolactone 50 mg BID   Chronic RF on 2L - 3L home O2: Back on 2L [place] - Continue supplemental O2, as needed  Hypokalemia: K+ 3.9 from 3.2, mag 2.2, wnl  - resolved - Continue K+ prn - continue mag supp - CTM, daily BMP  AKI: Cr 1.22  - Hold home lasix - CTM  Depression/Chronic Pain/Insomnia:  - Continue home meds  Enlarged heterogenous thyroid noted on Maxillofacial CT: - TSH on 4/10 0.449 - Thyroid ultrasound and follow-up outpatient  Barriers to discharge/continued care: Patient was traveling out of home state. Family would like patient transferred to another hospital [place], [place], [place]). Attending, Dr. Delton Coombes spoke with transfer center @ [facility]. Case management requested letter written by Attending. - Letter complete. Awaiting insurance authorization for transport to [facility]. Best practice:  Diet: Thin Pain/Anxiety/Delirium protocol (if indicated): Versed PRN VAP protocol (if indicated): N/A DVT prophylaxis: SCDs GI prophylaxis: PPI Glucose control: will CTM Mobility: Bedrest, soft restraints in place Code Status: Full Code Family Communication: CM updated family concerning barriers to flight transfer. Family cannot pay out of pocket. Awaiting insurance auth.  Disposition: Stepdown   Labs   CBC: Recent Labs  Lab 12/19/19 0625 12/19/19 0625 12/19/19 6811 12/20/19 0528 12/21/19 0248 12/22/19 0620 12/23/19 2317  WBC 5.0  --   --  3.8* 4.4 7.4 5.6  NEUTROABS 3.3  --   --   --   --   --  3.9  HGB 12.6   <  > 12.2 9.1* 9.0* 11.0* 10.2*  HCT 40.8   < > 36.0 28.6* 28.7* 35.3* 31.6*  MCV 77.3*  --   --  76.9* 79.1* 79.0* 76.0*  PLT 223  --   --  183 183 269 252   < > = values in this interval not displayed.    Basic Metabolic Panel: Recent Labs  Lab 12/21/19 0248 12/21/19 1111 12/22/19 0620 12/22/19 1114 12/22/19 1616 12/22/19 1616 12/22/19 2354 12/22/19 2354 12/23/19 0648 12/23/19 0836 12/23/19 1240 12/23/19 1622 12/23/19 2317  NA 157*   < > 156*   < > 149*   < > 146*   < > 147* 146* 146* 149* 146*  K 3.4*  --  2.9*  --  3.3*  --  3.0*  --  3.4* 3.2*  --   --  3.9  CL >130*  --  128*  --  120*  --  119*  --  118* 118*  --   --  117*  CO2 16*  --  16*  --  19*  --  15*  --  18* 20*  --   --  20*  GLUCOSE 141*  --  129*  --  115*  --  122*  --  95 99  --   --  118*  BUN 16  --  18  --  16  --  12  --  13 13  --   --  9  CREATININE 0.95  --  1.37*  --  1.35*  --  1.35*  --  1.32* 1.22*  --   --  1.22*  CALCIUM 9.8  --  10.4*  --  10.3  --  10.1  --  10.1 10.0  --   --  9.9  MG 1.8  --  1.8  --   --   --  2.2  --   --  2.3  --   --  2.2  PHOS  --   --  3.2  --   --   --  2.3*  --   --  2.6  --   --  1.8*   < > = values in this interval not displayed.   GFR: Estimated Creatinine Clearance: 52.8 mL/min (A) (by C-G formula based on SCr of 1.22 mg/dL (H)). Recent Labs  Lab 12/19/19 0625 12/19/19 0625 12/20/19 0528 12/21/19 0248 12/22/19 0620 12/23/19 2317  WBC 5.0   < > 3.8* 4.4 7.4 5.6  LATICACIDVEN 1.0  --   --   --   --   --    < > = values in this interval not displayed.    Liver Function Tests: Recent Labs  Lab 12/19/19 0625  AST 15  ALT 9  ALKPHOS 91  BILITOT 0.3  PROT 8.5*  ALBUMIN 4.0   No results for input(s): LIPASE, AMYLASE in the last 168 hours. Recent Labs  Lab 12/19/19 0625  AMMONIA 39*    ABG    Component Value Date/Time   PHART 7.348 (L) 12/19/2019 0635   PCO2ART 20.6 (L) 12/19/2019 0635   PO2ART 120.0 (H) 12/19/2019 0635   HCO3 11.3 (L)  12/19/2019 0635   TCO2 12 (L) 12/19/2019 0635   ACIDBASEDEF 12.0 (H) 12/19/2019 0635   O2SAT 99.0 12/19/2019 0635     Coagulation Profile: Recent Labs  Lab 12/20/19 0528 12/21/19 0537 12/22/19 0620 12/22/19 2354 12/23/19 2317  INR 1.4* 1.4* 1.5* 1.4* 1.4*    Cardiac Enzymes: Recent Labs  Lab 12/19/19 0625  CKTOTAL 181    HbA1C: No results found for: HGBA1C  CBG: Recent Labs  Lab 12/19/19 0436  GLUCAP 104*    Review of Systems:   No significant findings outside HPI and interval events.   Past Medical History  She,  has no past medical history on file.   Surgical History   History reviewed. No pertinent surgical history.   Social History   reports that she has never smoked. She has never used smokeless tobacco. She reports current alcohol use. She reports current drug use.   Family History   Her family history is not on file.   Allergies Allergies  Allergen Reactions  . Codeine Rash     Home Medications  Prior to Admission medications   Medication Sig Start Date End Date Taking? Authorizing Provider  bosentan (TRACLEER) 125 MG tablet Take 125 mg by mouth 2 (two) times daily.   Yes [provider]  busPIRone (BUSPAR) 10 MG tablet Take 10 mg by mouth 2 (two) times daily.  Yes [provider]  cholecalciferol (VITAMIN D3) 25 MCG (1000 UNIT) tablet Take 1,000 Units by mouth daily.   Yes [provider]  dicyclomine (BENTYL) 10 MG capsule Take 10 mg by mouth 4 (four) times daily -  before meals and at bedtime.   Yes [provider]  diphenoxylate-atropine (LOMOTIL) 2.5-0.025 MG tablet Take 1 tablet by mouth 4 (four) times daily as needed for diarrhea or loose stools.   Yes [provider]  epoprostenol (VELETRI) 1.5 MG injection Inject 6,000 mcg into the vein continuous. 60,000 ng/ml in 152ml solution infused over 24 hours. ( 4 vials per cassette)   Yes [provider]  ferrous sulfate 325 (65 FE) MG  tablet Take 325 mg by mouth daily with breakfast.   Yes [provider]  furosemide (LASIX) 80 MG tablet Take 80 mg by mouth 2 (two) times daily.   Yes [provider]  gabapentin (NEURONTIN) 800 MG tablet Take 800 mg by mouth 3 (three) times daily.   Yes [provider]  hydrocortisone-pramoxine (PROCTOFOAM-HC) rectal foam Place 1 applicator rectally 2 (two) times daily.   Yes [provider]  lactobacillus acidophilus (BACID) TABS tablet Take 2 tablets by mouth 2 (two) times daily.   Yes [provider]  LORazepam (ATIVAN) 1 MG tablet Take 1 mg by mouth 2 (two) times daily as needed for anxiety.   Yes [provider]  magnesium gluconate (MAGONATE) 500 MG tablet Take 500 mg by mouth 2 (two) times daily.   Yes [provider]  Multiple Vitamins-Minerals (MULTIVITAMIN WITH MINERALS) tablet Take 1 tablet by mouth daily.   Yes [provider]  OXYGEN Inhale 3 L into the lungs continuous.   Yes [provider]  potassium chloride (KLOR-CON) 10 MEQ tablet Take 40 mEq by mouth 2 (two) times daily.   Yes [provider]  sodium bicarbonate 650 MG tablet Take 650 mg by mouth 2 (two) times daily.   Yes [provider]  spironolactone (ALDACTONE) 100 MG tablet Take 100 mg by mouth 2 (two) times daily.   Yes [provider]  tadalafil, PAH, (ADCIRCA) 20 MG tablet Take 40 mg by mouth at bedtime.   Yes [provider]  temazepam (RESTORIL) 30 MG capsule Take 30 mg by mouth at bedtime.   Yes [provider]  warfarin (COUMADIN) 10 MG tablet Take 10 mg by mouth daily.   Yes [provider]     This note was initiated by Carolan Shiver. Meda Coffee, MS4.

## 2019-12-24 NOTE — Progress Notes (Signed)
PIV consult: Daughter reports after placement that pt is intolerant to chlor-prep. Will monitor site. Port dressing changed, alcohol used to prep site. Daughter asked that I not use betadine as well. Needle change due 4/16.  (Pt likely had MARSI in the past per daughter's description of site reaction.)

## 2019-12-24 NOTE — Progress Notes (Addendum)
NAME:  ADORIA KAWAMOTO, MRN:  [id number], DOB:  11/04/1955, LOS: 5 ADMISSION DATE:  12/19/2019 REFERRING MD:  Renae Fickle Physician, CHIEF COMPLAINT: Altered mental status  Brief History   Patient was brought into the emergency room following being found knocking on people's doors at a hotel According to daughter, patient fell about 2 days ago, was slow to get up but was able to get up unable to move around Did not exhibit any weakness on any side, was slow but appropriate Patient was wandering around and knocking on people's doors was why she was brought into the hospital early this morning She had had a subdural bleed in the past about 3 years ago managed at [facility] Patient primarily resides in [place] Herself and her daughter were on a trip to go to [place] to see pts physician who was managing her pulmonary hypertension Treated for C. difficile in December 2020  On an infusion of Veletri  Past Medical History  History of subdural hemorrhage History of pulmonary hypertension Ataxia Chronic pain syndrome Chronic respiratory failure on home oxygen Reformed smoker Depression Hypercoagulable state-history of prothrombin mutation Atrial fibrillation Chronic metabolic acidosis  Significant Hospital Events   Required multiple doses of medications to sedate this morning as she was very agitated-ketamine, Geodon, benzodiazepines  Consults:  Neurosurgery Neurology  Procedures:  None   Significant Diagnostic Tests:  CT Head 12/20/19@11 :09  IMPRESSION: 1. Moderate acute subdural hematoma over the left convexity most prominent over the temporal region with there is focal bulbous prominence of this hematoma measuring 2 cm in thickness. This subdural hematoma appears to communicate with an area of acute parenchymal hemorrhage over the left temporal lobe measuring 2.2 x 3.1 cm. There is minimal associated acute subarachnoid hemorrhage over the left temporal region. Small amount of  acute subdural hemorrhage over the anterior interhemispheric fissure as well as left tentorium. Local mass effect over the left convexity and mild midline shift to the right of 5-6 mm.  2. 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 Postsurgical defect over the left frontal parietal skull.  3.  No acute facial bone fracture.  4.  No acute cervical spine injury.  5. Enlarged heterogeneous thyroid likely goiter. Recommend thyroid ultrasound (ref: J Am Coll Radiol. 2015 Feb;12(2): 143-50).  CT Head 12/19/19 @15 :29 IMPRESSION: 1. Stable extensive left side subdural hematoma, lobulated and ranging from 7 mm to 20 mm in thickness. 2. Stable left temporal lobe hemorrhagic contusion. Stable small volume left para falcine subdural blood. 3. Stable intracranial mass effect with rightward midline shift at 7 mm. 4. No new intracranial abnormality. 5. Mild bilateral mastoid effusions. No acute skull fracture Identified. CT head 4/12 > unchanged left temporal lobe contusion and left cerebral SDH. CT of the head 12/22/2018 with no significant change 12/22/2018 one 2D echo which basically shows normal LV function right and left Micro Data:  SARS coronavirus negative Influenza A and B negative  Antimicrobials:  None  Interim history/subjective:  Placed on O2 due to low O2 sats.  Will be transferred to stepdown unit.  Family wants her transferred to [facility] paperwork has been instituted to make that happen.  Objective   Blood pressure 105/62, pulse (!) 120, temperature 98.2 F (36.8 C), temperature source Axillary, resp. rate (!) 22, height 5\' 8"  (1.727 m), weight 76.8 kg, SpO2 93 %.        Intake/Output Summary (Last 24 hours) at 12/24/2019 [id number]  Last data filed at 12/24/2019 0600 Gross per 24 hour  Intake 200 ml  Output 950 ml  Net -750 ml   Filed Weights   12/22/19 0500 12/23/19 0500  12/24/19 0500  Weight: 77.2 kg 76.8 kg 76.8 kg   Physical Exam: General: Well-nourished well-developed female no acute distress at rest HEENT: Pupils equal reactive light no JVD or lymphadenopathy is appreciated Neuro: Affect remains dull.  Speech is difficult to understand.  Somewhat lethargic but does answer questions CV: Currently sinus rhythm PULM: Diminished in the bases, O2 sats 83% on room air placed on 2 L nasal cannula Chest-left Port-A-Cath in place,On an infusion of Veletri GI: soft, bsx4 active  Extremities: warm/dry,  edema  Skin: no rashes or lesions     Assessment & Plan:   Altered mental status - UDS + benzos otherwise tox work-up negative..  Presumed due to acute SDH Acute left subdural hematoma in setting of anticoagulation, fall- S/p Kcentral and Vitamin K. INR normalized.  Repeat CT head stable 4/12 12/22/2019 CT of the head shows no significant change in findings   Appreciate neurology's input Off hypertonic saline Maintain adequate blood pressure Currently in sinus rhythm Timing of anticoagulation remains open Continue current treatment for pulmonary hypertension   Possibly acute on chronic subdural hematoma Appreciate neurology and neurosurgery evaluation, surgical treatment not recommended at this point.  Following for clinical improvement Continue Keppra, valproic acid per neurology recommendations Hypertonic saline held, sodium remains at goal 156 SBP goal less than 140 Anticoagulation on hold, unclear whether she will be a candidate to have this restarted.  Will have to the risk benefits given her factor II mutation, A. fib, pulmonary hypertension.  Coagulopathy due to medication- resolved. Lab Results  Component Value Date   INR 1.4 (H) 12/23/2019   INR 1.4 (H) 12/22/2019   INR 1.5 (H) 12/22/2019    Anticoagulations remain on hold.  Timing of resumption of anticoagulants remains open.   Chronic respiratory failure - On 2 to 3 L of  oxygen. Titrate oxygen to keep sats greater than 88%  Idiopathic pulmonary arterial hypertension -has been managed in [place], plan was in place to transition management to physician in [place]. Continue current medications O2 as needed    Hypokalemia, hypomagnesemia repleted on 12/22/2019 Recent Labs  Lab 12/23/19 0648 12/23/19 0836 12/23/19 2317  K 3.4* 3.2* 3.9  Monitor replete as needed, note she is on Aldactone   Acute on Chronic kidney disease Lab Results  Component Value Date   CREATININE 1.22 (H) 12/23/2019   CREATININE 1.22 (H) 12/23/2019   CREATININE 1.32 (H) 12/23/2019   Currently Lasix is on hold She is on Aldactone potassium level 3.9 last noted Resumption of Lasix in the future. Labs ordered for 12/24/2019    History of depression, ataxia, chronic pain. Continue supportive care Only BuSpar at this time Not on Neurontin   Best practice:  Diet: Dysphagia 2 Pain/Anxiety/Delirium protocol (if indicated): Precedex VAP protocol (if indicated): Not applicable DVT prophylaxis: SCD timing of resumption of anticoagulation pending GI prophylaxis: PPI Glucose control: Will monitor Mobility: Advance activity as tolerated Code Status: Full code Family Communication:  Disposition: Transfer to progressive neuro unit and to [facility] service. Spoke with [facility] md for pu Dr. Cyndia Skeeters  App  CC time 69 minutes  Richardson Landry Shavar Gorka ACNP Acute Care Nurse Practitioner [place] Please consult [place] 12/24/2019, 7:58 AM

## 2019-12-24 NOTE — Progress Notes (Signed)
Cassette changed tonight at 1930 with daughter and rapid response at bedside - expiration date and time was verified prior to administration.    Fredonia Highland, PharmD, BCPS Clinical Pharmacist Please check AMION for all [facility] numbers 12/24/2019

## 2019-12-25 DIAGNOSIS — R4689 Other symptoms and signs involving appearance and behavior: Secondary | ICD-10-CM

## 2019-12-25 LAB — CBC WITH DIFFERENTIAL/PLATELET
Abs Immature Granulocytes: 0.05 10*3/uL (ref 0.00–0.07)
Basophils Absolute: 0 10*3/uL (ref 0.0–0.1)
Basophils Relative: 1 %
Eosinophils Absolute: 0.2 10*3/uL (ref 0.0–0.5)
Eosinophils Relative: 3 %
HCT: 31.5 % — ABNORMAL LOW (ref 36.0–46.0)
Hemoglobin: 9.8 g/dL — ABNORMAL LOW (ref 12.0–15.0)
Immature Granulocytes: 1 %
Lymphocytes Relative: 22 %
Lymphs Abs: 1.4 10*3/uL (ref 0.7–4.0)
MCH: 24.6 pg — ABNORMAL LOW (ref 26.0–34.0)
MCHC: 31.1 g/dL (ref 30.0–36.0)
MCV: 78.9 fL — ABNORMAL LOW (ref 80.0–100.0)
Monocytes Absolute: 0.4 10*3/uL (ref 0.1–1.0)
Monocytes Relative: 6 %
Neutro Abs: 4.3 10*3/uL (ref 1.7–7.7)
Neutrophils Relative %: 67 %
Platelets: 268 10*3/uL (ref 150–400)
RBC: 3.99 MIL/uL (ref 3.87–5.11)
RDW: 19.9 % — ABNORMAL HIGH (ref 11.5–15.5)
WBC: 6.3 10*3/uL (ref 4.0–10.5)
nRBC: 0 % (ref 0.0–0.2)

## 2019-12-25 LAB — BASIC METABOLIC PANEL
Anion gap: 14 (ref 5–15)
BUN: 6 mg/dL — ABNORMAL LOW (ref 8–23)
CO2: 19 mmol/L — ABNORMAL LOW (ref 22–32)
Calcium: 9.6 mg/dL (ref 8.9–10.3)
Chloride: 113 mmol/L — ABNORMAL HIGH (ref 98–111)
Creatinine, Ser: 1.12 mg/dL — ABNORMAL HIGH (ref 0.44–1.00)
GFR calc Af Amer: >60 mL/min (ref >60)
GFR calc non Af Amer: 53 mL/min — ABNORMAL LOW (ref >60)
Glucose, Bld: 132 mg/dL — ABNORMAL HIGH (ref 70–99)
Potassium: 3 mmol/L — ABNORMAL LOW (ref 3.5–5.1)
Sodium: 146 mmol/L — ABNORMAL HIGH (ref 135–145)

## 2019-12-25 LAB — MAGNESIUM: Magnesium: 2.1 mg/dL (ref 1.7–2.4)

## 2019-12-25 LAB — PROTIME-INR
INR: 1.3 — ABNORMAL HIGH (ref 0.8–1.2)
Prothrombin Time: 16.3 seconds — ABNORMAL HIGH (ref 11.4–15.2)

## 2019-12-25 LAB — SODIUM: Sodium: 146 mmol/L — ABNORMAL HIGH (ref 135–145)

## 2019-12-25 LAB — PHOSPHORUS: Phosphorus: 2.3 mg/dL — ABNORMAL LOW (ref 2.5–4.6)

## 2019-12-25 MED ORDER — SODIUM CHLORIDE 0.9% FLUSH: 10.0000 mL | INTRAVENOUS | Status: DC | PRN

## 2019-12-25 MED ORDER — SODIUM CHLORIDE 0.9% FLUSH
10.0000 mL | Freq: Two times a day (BID) | INTRAVENOUS | Status: DC
  Administered 2019-12-25: 10 mL

## 2019-12-25 MED ORDER — POTASSIUM CHLORIDE 10 MEQ/100ML IV SOLN
10.0000 meq | INTRAVENOUS | Status: AC
  Administered 2019-12-25: 10 meq via INTRAVENOUS
  Filled 2019-12-25 (×3): qty 100

## 2019-12-25 MED ORDER — K PHOS MONO-SOD PHOS DI & MONO 155-852-130 MG PO TABS
250.0000 mg | ORAL_TABLET | Freq: Two times a day (BID) | ORAL | Status: DC
  Administered 2019-12-25 – 2019-12-26 (×2): 250 mg via ORAL
  Filled 2019-12-25 (×3): qty 1

## 2019-12-25 MED FILL — Epoprostenol Sodium For Inj 1.5 MG: INTRAVENOUS | Qty: 20 | Status: AC

## 2019-12-25 NOTE — Progress Notes (Signed)
Pt refusing to take all oral meds, Dr.Kumar made aware.

## 2019-12-25 NOTE — Progress Notes (Signed)
Pt continues to refuse all treatments. Pt needs new IV but has refused IV team as well as phlebotomy sticks. Dr. Lucianne Muss made aware, no new orders.

## 2019-12-25 NOTE — TOC Progression Note (Addendum)
Transition of Care [facility]) - Progression Note    Patient Details  Name: Elizabeth Shepherd MRN: [id number] Date of Birth: Jan 03, 1956  Transition of [facility]) CM/SW Contact  Astrid Drafts Berna Spare, RN Phone Number: 12/25/2019, 3:28 PM  Clinical Narrative: Nadean Corwin [employer] to check status of insurance auth. ( Phone:  [phone]) At this time, case is still under review.  TOC team will provide updates as they are available.        Expected Discharge Plan: Acute to Acute Transfer Barriers to Discharge: Insurance Authorization(flight)  Expected Discharge Plan and Services Expected Discharge Plan: Acute to Acute Transfer   Discharge Planning Services: CM Consult   Living arrangements for the past 2 months: Single Family Home                                       Social Determinants of Health (SDOH) Interventions    Readmission Risk Interventions No flowsheet data found.  Quintella Baton, RN, BSN  Trauma/Neuro ICU Case Manager [phone]

## 2019-12-25 NOTE — Progress Notes (Signed)
SLP Cancellation Note  Patient Details Name: Elizabeth Shepherd MRN: [id number] DOB: 10-24-1955   Cancelled treatment:       Reason Eval/Treat Not Completed: Patient declined, no reason specified. Attempted to see pt for swallowing treatment and potential to advance diet. She declines all POs offered at this time. RN says that she seems to do well with thin liquids, but she has not been eating or taking any meds. Will f/u as able.    Mahala Menghini., M.A. CCC-SLP Acute Rehabilitation Services Pager [phone] Office [phone]  12/25/2019, 4:01 PM

## 2019-12-25 NOTE — Progress Notes (Signed)
PROGRESS NOTE    Elizabeth Shepherd  FUX:[id number] DOB: 1956/03/11 DOA: 12/19/2019 PCP: Patient, No Pcp Per   Brief Narrative: This 64 y.o. female with aforementioned hhx who her daughter reports fell 3 times several weeks ago. Daughter was made mention of the frequent falls after the last event when her step-father was not able to pick up patient from the floor. LOC during events is unknown. These episode of fall occurred in the past (2016) when the patient fell in the bathroom at a doctor's visit, hitting her head and suffering a SDH s/p burr hole. Her condition has been stable since to the point that she was placed on coumadin for her afib. Initially after the most recent events patient was found to be "ok" after falls and continued with her ADLs. During the week of 4/2 her daughter noticed some small changes in behavior with patient writhing hands in fabric and behaving as if she had "dementia". Patient and daughter were set to travel from their home in [place] to [place] to consult with a pulmonologist regarding a second opinion regarding her pulm HTN infusion, d/t infections from picc line. They stopped at a hotel and during the night the patient left the room and started knocking on the doors at which time EMS was called and patient was transported to [facility] ED.   On arrival patient was agitated, confused and aggressive, to the point that staff were threatened. ED administered Haldol, Geodon, Ativan and ketamine with little improvement and transitioned to a precedex infusion with IV Versed.   CT scan w/o contrast revealed an intracranial bleed with rightward 7 mm midline shift and NSG and neurology were consulted. INR on arrival was 5.6. Patient was not intubated and transferred to 4N ICU.    Assessment & Plan:   Active Problems:   Subdural hematoma (HCC)   Aggressive behavior   Encephalopathy   Intracranial bleed (HCC)   Acute left subdural hematoma while on anticoagulation following traumatic  falls.  Clinically improved slowly with conservative management, serial CT scans have been stable.  She is not back to her neurological baseline.  Neurosurgically typically recommends waiting at least 2 weeks before considering restart anticoagulation although they recommend in this circumstance it may be reasonable to start heparin after 1 week since she has factor II mutation.  Currently on empiric Keppra and valproate.  Question whether we may be able to stop 1 or both going forward.  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 Sherrie Mustache at [facility].  The patient has been interested in possibly titrating off epoprostenol.  This might actually be reasonable given her falls, presyncope, reassuring echocardiogram from this admission.  Will need to be managed by her pulmonologist, probably as an inpatient.  We are looking to facilitate a transfer to [facility].  Dr. Sherrie Mustache does want her to transfer when possible. we have written a letter of medical necessity to be assessed by her insurance company, determine the cost of the transportation.  Port-A-Cath needle needs to be changed on 4/16.  Her Aldactone was decreased 2 days ago and Lasix held.  May be able to restart these as her renal function stabilizes  Atrial fibrillation with periods of RVR.  Consider starting rate control medication, metoprolol.  Depression/Chronic Pain/Insomnia:  - Continue home meds   DVT prophylaxis: SCDS Code Status: Full Family Communication: CM updated family concerning barriers to flight transfer. Family cannot pay out of pocket. Awaiting insurance auth. Disposition  Plan: Awaiting insurance authorization to transfer to [facility] Barriers:  Patient was traveling out of home state. Family would like patient transferred to another hospital [place], [place], [place]). Attending, Dr. Lamonte Sakai spoke with transfer center @ [place]. Case management requested letter written by  Attending. - Letter complete. Awaiting insurance authorization for transport to [facility].   Consultants:   Pulmonology  PCCM  Procedures:   Antimicrobials:  Anti-infectives (From admission, onward)   None      Subjective: Patient was seen and examined at bedside, she follows commands but she is still more sleepy.  Objective: Vitals:   12/25/19 0900 12/25/19 1000 12/25/19 1100 12/25/19 1200  BP:   105/69 117/76  Pulse: (!) 108 (!) 108 (!) 106   Resp: (!) 22 (!) 23 19   Temp:    98.7 F (37.1 C)  TempSrc:    Oral  SpO2: 100% 95% 94% 98%  Weight:      Height:        Intake/Output Summary (Last 24 hours) at 12/25/2019 1408 Last data filed at 12/25/2019 1200 Gross per 24 hour  Intake 321.44 ml  Output --  Net 321.44 ml   Filed Weights   12/23/19 0500 12/24/19 0500 12/25/19 0500  Weight: 76.8 kg 76.8 kg 76.5 kg    Examination:  General exam: Appears calm and comfortable  Respiratory system: Clear to auscultation. Respiratory effort normal. Cardiovascular system: S1 & S2 heard, RRR. No JVD, murmurs, rubs, gallops or clicks. No pedal edema. Gastrointestinal system: Abdomen is nondistended, soft and nontender. No organomegaly or masses felt. Normal bowel sounds heard. Central nervous system: Alert and oriented. No focal neurological deficits. Extremities: Symmetric 5 x 5 power. Skin: No rashes, lesions or ulcers Psychiatry: Judgement and insight appear normal. Mood & affect appropriate.     Data Reviewed: I have personally reviewed following labs and imaging studies  CBC: Recent Labs  Lab 12/19/19 0625 12/19/19 0635 12/20/19 0528 12/21/19 0248 12/22/19 0620 12/23/19 2317 12/25/19 0133  WBC 5.0   < > 3.8* 4.4 7.4 5.6 6.3  NEUTROABS 3.3  --   --   --   --  3.9 4.3  HGB 12.6   < > 9.1* 9.0* 11.0* 10.2* 9.8*  HCT 40.8   < > 28.6* 28.7* 35.3* 31.6* 31.5*  MCV 77.3*   < > 76.9* 79.1* 79.0* 76.0* 78.9*  PLT 223   < > 183 183 269 252 268   < > = values in this  interval not displayed.   Basic Metabolic Panel: Recent Labs  Lab 12/22/19 2354 12/22/19 2354 12/23/19 6389 12/23/19 3734 12/23/19 0836 12/23/19 1240 12/23/19 1622 12/23/19 2317 12/24/19 1256 12/24/19 2319 12/25/19 0133  NA 146*   < > 147*   < > 146*   < > 149* 146* 149* 146* 146*  K 3.0*   < > 3.4*  --  3.2*  --   --  3.9 3.6  --  3.0*  CL 119*   < > 118*  --  118*  --   --  117* 116*  --  113*  CO2 15*   < > 18*  --  20*  --   --  20* 22  --  19*  GLUCOSE 122*   < > 95  --  99  --   --  118* 111*  --  132*  BUN 12   < > 13  --  13  --   --  9 8  --  6*  CREATININE 1.35*   < > 1.32*  --  1.22*  --   --  1.22* 1.08*  --  1.12*  CALCIUM 10.1   < > 10.1  --  10.0  --   --  9.9 9.8  --  9.6  MG 2.2  --   --   --  2.3  --   --  2.2 2.3  --  2.1  PHOS 2.3*  --   --   --  2.6  --   --  1.8* 2.1*  --  2.3*   < > = values in this interval not displayed.   GFR: Estimated Creatinine Clearance: 53.2 mL/min (A) (by C-G formula based on SCr of 1.12 mg/dL (H)). Liver Function Tests: Recent Labs  Lab 12/19/19 0625  AST 15  ALT 9  ALKPHOS 91  BILITOT 0.3  PROT 8.5*  ALBUMIN 4.0   No results for input(s): LIPASE, AMYLASE in the last 168 hours. Recent Labs  Lab 12/19/19 0625  AMMONIA 39*   Coagulation Profile: Recent Labs  Lab 12/21/19 0537 12/22/19 0620 12/22/19 2354 12/23/19 2317 12/25/19 0133  INR 1.4* 1.5* 1.4* 1.4* 1.3*   Cardiac Enzymes: Recent Labs  Lab 12/19/19 0625  CKTOTAL 181   BNP (last 3 results) No results for input(s): PROBNP in the last 8760 hours. HbA1C: No results for input(s): HGBA1C in the last 72 hours. CBG: Recent Labs  Lab 12/19/19 0436  GLUCAP 104*   Lipid Profile: No results for input(s): CHOL, HDL, LDLCALC, TRIG, CHOLHDL, LDLDIRECT in the last 72 hours. Thyroid Function Tests: No results for input(s): TSH, T4TOTAL, FREET4, T3FREE, THYROIDAB in the last 72 hours. Anemia Panel: No results for input(s): VITAMINB12, FOLATE, FERRITIN,  TIBC, IRON, RETICCTPCT in the last 72 hours. Sepsis Labs: Recent Labs  Lab 12/19/19 [id number]  LATICACIDVEN 1.0    Recent Results (from the past 240 hour(s))  Respiratory Panel by RT PCR (Flu A&B, Covid) - Nasopharyngeal Swab     Status: None   Collection Time: 12/19/19  8:17 AM   Specimen: Nasopharyngeal Swab  Result Value Ref Range Status   SARS Coronavirus 2 by RT PCR NEGATIVE NEGATIVE Final    Comment: (NOTE) SARS-CoV-2 target nucleic acids are NOT DETECTED. The SARS-CoV-2 RNA is generally detectable in upper respiratoy specimens during the acute phase of infection. The lowest concentration of SARS-CoV-2 viral copies this assay can detect is 131 copies/mL. A negative result does not preclude SARS-Cov-2 infection and should not be used as the sole basis for treatment or other patient management decisions. A negative result may occur with  improper specimen collection/handling, submission of specimen other than nasopharyngeal swab, presence of viral mutation(s) within the areas targeted by this assay, and inadequate number of viral copies (<131 copies/mL). A negative result must be combined with clinical observations, patient history, and epidemiological information. The expected result is Negative. Fact Sheet for Patients:  https://www.moore.com/https://www.fda.gov/media/142436/download Fact Sheet for Healthcare Providers:  https://www.young.biz/https://www.fda.gov/media/142435/download This test is not yet ap proved or cleared by the [place] FDA and  has been authorized for detection and/or diagnosis of SARS-CoV-2 by FDA under an Emergency Use Authorization (EUA). This EUA will remain  in effect (meaning this test can be used) for the duration of the COVID-19 declaration under Section 564(b)(1) of the Act, 21 U.S.C. section 360bbb-3(b)(1), unless the authorization is terminated or revoked sooner.    Influenza A by PCR NEGATIVE NEGATIVE Final   Influenza B by PCR NEGATIVE NEGATIVE Final  Comment: (NOTE) The  Xpert Xpress SARS-CoV-2/FLU/RSV assay is intended as an aid in  the diagnosis of influenza from Nasopharyngeal swab specimens and  should not be used as a sole basis for treatment. Nasal washings and  aspirates are unacceptable for Xpert Xpress SARS-CoV-2/FLU/RSV  testing. Fact Sheet for Patients: https://www.moore.com/ Fact Sheet for Healthcare Providers: https://www.young.biz/ This test is not yet approved or cleared by the [place] FDA and  has been authorized for detection and/or diagnosis of SARS-CoV-2 by  FDA under an Emergency Use Authorization (EUA). This EUA will remain  in effect (meaning this test can be used) for the duration of the  Covid-19 declaration under Section 564(b)(1) of the Act, 21  U.S.C. section 360bbb-3(b)(1), unless the authorization is  terminated or revoked. Performed at [facility], [address], [place], [place] [address]   MRSA PCR Screening     Status: None   Collection Time: 12/19/19  2:00 PM   Specimen: Nasopharyngeal  Result Value Ref Range Status   MRSA by PCR NEGATIVE NEGATIVE Final    Comment:        The GeneXpert MRSA Assay (FDA approved for NASAL specimens only), is one component of a comprehensive MRSA colonization surveillance program. It is not intended to diagnose MRSA infection nor to guide or monitor treatment for MRSA infections. Performed at [facility], [address] [place], [place] [address]   C Difficile Quick Screen w PCR reflex     Status: None   Collection Time: 12/19/19  4:18 PM   Specimen: STOOL  Result Value Ref Range Status   C Diff antigen NEGATIVE NEGATIVE Final   C Diff toxin NEGATIVE NEGATIVE Final   C Diff interpretation No C. difficile detected.  Final         Radiology Studies: DG Chest Port 1 View  Result Date: 12/24/2019 CLINICAL DATA:  Abnormal respirations.  Subdural hematoma. EXAM: PORTABLE CHEST 1 VIEW COMPARISON:  Chest x-ray  12/19/2019. FINDINGS: Patient is rotated. PowerPort catheter stable position. Mediastinum and hilar structures appear unremarkable. Previously identified nodular opacity noted over the right hilum no longer visualized. This is most likely vascular. Heart size stable. No focal infiltrate. No pleural effusion or pneumothorax. IMPRESSION: 1.  PowerPort catheter stable position. 2. No acute cardiopulmonary disease. Previously identified nodular opacity over the right hilum no longer identified. This was most likely vascular. Electronically Signed   By: Maisie Fus  Register   On: 12/24/2019 06:43    Scheduled Meds: . bosentan  125 mg Oral BID  . busPIRone  10 mg Oral BID  . Chlorhexidine Gluconate Cloth  6 each Topical Daily  . cholecalciferol  1,000 Units Oral Daily  . dicyclomine  10 mg Oral TID AC & HS  . ferrous sulfate  325 mg Oral Q breakfast  . gabapentin  400 mg Oral TID  . hydrocortisone-pramoxine  1 applicator Rectal BID  . lactobacillus acidophilus  2 tablet Oral BID  . magnesium gluconate  500 mg Oral BID  . mouth rinse  15 mL Mouth Rinse BID  . multivitamin with minerals  1 tablet Oral Daily  . phosphorus  250 mg Oral BID  . potassium chloride  40 mEq Oral BID  . sodium bicarbonate  650 mg Oral BID  . sodium chloride flush  10-40 mL Intracatheter Q12H  . spironolactone  50 mg Oral BID  . tadalafil  40 mg Oral QHS  . temazepam  30 mg Oral QHS  . valproic  acid  500 mg Oral TID   Continuous Infusions: . Epoprostenol (Veletri) IV 60 mcg/mL (100 mL)  3.92 mL/hr at 12/20/19 2000  . levETIRAcetam Stopped (12/25/19 1035)     LOS: 6 days    Time spent: 25 min    Aariv Medlock, MD [facility]   If 7PM-7AM, please contact night-coverage

## 2019-12-25 NOTE — Progress Notes (Signed)
At patient bedside for port needle change.  Daughter discussing this with patient.  Daughter asked patient if she was going to allow IV team to do this, patient states no.  Aware of risks involved with not changing needle.  Daughter states that she is going to be taking patient to another facility tomorrow and patient is agreeable to have port needle changed there.  Bedside RN updated.

## 2019-12-25 NOTE — Progress Notes (Signed)
Subjective: Patient reports no headaches  Objective: Vital signs in last 24 hours: Temp:  [98.2 F (36.8 C)-98.9 F (37.2 C)] 98.2 F (36.8 C) (04/16 0800) Pulse Rate:  [72-124] 113 (04/16 0700) Resp:  [11-24] 21 (04/16 0700) BP: (83-139)/(58-91) 107/61 (04/16 0700) SpO2:  [92 %-100 %] 96 % (04/16 0700) Weight:  [76.5 kg] 76.5 kg (04/16 0500)  Intake/Output from previous day: 04/15 0701 - 04/16 0700 In: 221.4 [IV Piggyback:221.4] Out: -  Intake/Output this shift: No intake/output data recorded.  More alert today, oriented to person, hospital but still with some confusion and difficulty following complex commands Right facial droop, mild right pronator drift  Lab Results: Recent Labs    12/23/19 2317 12/25/19 0133  WBC 5.6 6.3  HGB 10.2* 9.8*  HCT 31.6* 31.5*  PLT 252 268   BMET Recent Labs    12/24/19 1256 12/24/19 1256 12/24/19 2319 12/25/19 0133  NA 149*   < > 146* 146*  K 3.6  --   --  3.0*  CL 116*  --   --  113*  CO2 22  --   --  19*  GLUCOSE 111*  --   --  132*  BUN 8  --   --  6*  CREATININE 1.08*  --   --  1.12*  CALCIUM 9.8  --   --  9.6   < > = values in this interval not displayed.    Studies/Results: DG Chest Port 1 View  Result Date: 12/24/2019 CLINICAL DATA:  Abnormal respirations.  Subdural hematoma. EXAM: PORTABLE CHEST 1 VIEW COMPARISON:  Chest x-ray 12/19/2019. FINDINGS: Patient is rotated. PowerPort catheter stable position. Mediastinum and hilar structures appear unremarkable. Previously identified nodular opacity noted over the right hilum no longer visualized. This is most likely vascular. Heart size stable. No focal infiltrate. No pleural effusion or pneumothorax. IMPRESSION: 1.  PowerPort catheter stable position. 2. No acute cardiopulmonary disease. Previously identified nodular opacity over the right hilum no longer identified. This was most likely vascular. 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  As such I would recommend continuing to hold the coumadin-- her team at [facility] can decide when to restart based on their risk/benefit assessment. - recommend CT scan in 2-4 weeks to assess for resolution.  If her SDH is not resolving but is rather persistent but liquefying, she may be a candidate for bur hole drainage, similar to situation she experienced before.  Daughter verbalized understanding and agreement with the plan.   Bedelia Person 12/25/2019, 8:53 AM

## 2019-12-25 NOTE — Progress Notes (Signed)
Epo cassette/tubing changed tonight at 1930 with daughter and ICU RN at bedside - expiration date and time was verified prior to administration.   Patient refused needle exchange  Patient's PTA cartridges that are now expired were returned to patient's daughter  Elmer Sow, PharmD, BCPS, BCCCP Clinical Pharmacist [phone]  Please check AMION for all [facility] numbers  12/25/2019 7:44 PM

## 2019-12-25 NOTE — Progress Notes (Signed)
MD at bedside, pt persistent with refusing all areas of care. Pt stated to RN and MD, "I will slit Elizabeth Shepherd throat before I take another pill. I don't want anymore treatment." Pt expresses she will die anyways and wishes to stop all care and go home. MD will put in psych consult.

## 2019-12-26 MED ORDER — K PHOS MONO-SOD PHOS DI & MONO 155-852-130 MG PO TABS: 250.0000 mg | ORAL_TABLET | Freq: Two times a day (BID) | ORAL | Status: DC

## 2019-12-26 MED ORDER — POTASSIUM CHLORIDE 20 MEQ PO PACK: 40.0000 meq | PACK | Freq: Once | ORAL | Status: DC

## 2019-12-26 MED FILL — Epoprostenol Sodium For Inj 1.5 MG: INTRAVENOUS | Qty: 20 | Status: AC

## 2019-12-26 NOTE — Progress Notes (Signed)
  NEUROSURGERY PROGRESS NOTE   Pt has refused all treatments overnight.  EXAM:  BP 108/74 (BP Location: Right Arm)   Pulse (!) 111   Temp 99 F (37.2 C) (Oral)   Resp 19   Ht 5\' 8"  (1.727 m)   Wt 76.5 kg   SpO2 92%   BMI 25.64 kg/m   Easily arouses Answers simple questions from her daughter Right facial droop Moves all extremities  IMPRESSION:  64 y.o. female with severe medically intractable pulmonary HTN with left SDH. Not a candidate for operative decompression given the likelihood of postoperative long-term vent dependence and high risk of morbidity. Could consider subacute treatment in a few weeks without GA after hematoma liquefies.  PLAN: - As patient refuses all care here, may consider d/c and family plans on taking pt back to 79 where she has agreed to resume care.

## 2019-12-26 NOTE — Progress Notes (Signed)
At bedside with Dr.Kumar, Pt's daughter & pt's niece wishes to sign pt out AMA. Dr.Kumar expressed concerns and risks of discharge but family wishes to move forward and drive pt back to [facility]. Pt's daughter signed AMA paper, RN witnessed.

## 2019-12-26 NOTE — Progress Notes (Signed)
Pt continues to be mostly noncompliant, refusing meds and other treatments. Pts daughter believes the best thing for her mother would be to leave AMA or d/c in the morning to continue care at [facility].  The pt has expressed her desire to resume care at [facility], but insurance would not cover such transfer.  [facility] on call notified as well as [facility] via text page.  Will continue to monitor.

## 2019-12-26 NOTE — Discharge Summary (Signed)
Physician Discharge Summary  Elizabeth Shepherd OMB:[id number] DOB: 05-02-1956 DOA: 12/19/2019  PCP: Patient, No Pcp Per  Admit date: 12/19/2019   Discharge date: 12/26/2019  Admitted From:  Home Disposition:  Left Against medical advice.  Recommendations for Outpatient Follow-up:  1. Follow up with PCP in 1-2 weeks 2. Please obtain BMP/CBC in one week 3. Please follow up on the following pending results:  Home Health: No Equipment/Devices: No  Discharge Condition: Fair CODE STATUS:Full code Diet recommendation: Heart Healthy / Carb Modified / Regular / Dysphagia   Brief/Interim Summary: This 64 y.o. female with aforementioned hhx who her daughter reports fell 3 times several weeks ago. Daughter has made mention of the frequent falls after the last event when her step-father was not able to pick up patient from the floor. LOC during events is unknown. These episode of fall occurred in the past (2016) when the patient fell in the bathroom at a doctor's visit, hitting her head and suffering a SDH s/p burr hole. Her condition has been stable since to the point that she was placed on coumadin for her atrial fibrillation. Initially after the most recent events patient was found to be "ok" after falls and continued with her ADLs. During the week of 4/2 her daughter noticed some small changes in behavior with patient writhing hands in fabric and behaving as if she had "dementia". Patient and daughter were set to travel from their home in [place] to [place] to consult with a pulmonologist regarding a second opinion regarding her pulm HTN infusion, d/t infections from picc line. They stopped at a hotel and during the night the patient left the room and started knocking on the doors at which time EMS was called and patient was transported to [facility] ED.   On arrival patient was agitated, confused and aggressive, to the point that staff were threatened. ED administered Haldol, Geodon, Ativan and ketamine with little  improvement and transitioned to a precedex infusion with IV Versed.   CT scan w/o contrast revealed an intracranial bleed with rightward 7 mm midline shift and NSG and neurology were consulted. INR on arrival was 5.6. Patient was not intubated and transferred to 4N ICU.  She was managed in the ICU for 7 days.  She was transferred to the progressive unit.  The plan was to transfer her to [facility] where her pulmonologist works.  PCCM has spoken with Dr. Sherrie Mustache pulmonologist at [facility] and he accepted the patient.  Case management was helping for the arrangement of transfer to [facility].  Daughter has been very proactive with her mother's care.  She has contacted neurosurgery, pulmonology and neurologist.  Patient was very uncooperative.  She is refusing all oral medications,  refusing IV line.  She states she wants to die.  She appeared very depressed, stat psych consult ordered.  Daughter does not want to make any further delay in transport.  She states she wants to take her mother in her own car to the [facility] emergency room.  Explained to her in detail that she is not stable for transport in a regular car.  She understood risks and benefits, patient herself also agreed with daughter and wants to leave, she has electrolyte abnormalities.  Patient left AGAINST MEDICAL ADVICE.  Emergency room physician at [facility] was notified that this patient has signed AGAINST MEDICAL ADVICE and patient is going to the ER so the required consults should be called.   Assessment & Plan:   Active Problems:  Subdural hematoma (HCC)   Aggressive behavior   Encephalopathy   Intracranial bleed (HCC)   Acute left subdural hematoma while on anticoagulation following traumatic falls. Clinically improved slowly with conservative management, serial CT scans have been stable. She is not back to her neurological baseline. 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 Currently on empiric Keppra and valproate. Question whether we may be able to stop 1 or both going forward.  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 Sherrie Mustache at [facility]. The patient has been interested in possibly titrating off epoprostenol. This might actually be reasonable given her falls, presyncope, reassuring echocardiogram from this admission. Will need to be managed by her pulmonologist, probably as an inpatient. We are looking to facilitate a transfer to [facility]. Dr. Sherrie Mustache does want her to transfer when possible. we have written a letter of medical necessity to be assessed by her insurance company, determine the cost of the transportation. Port-A-Cath needle needs to be changed on 4/16. Her Aldactone was decreased 2 days ago and Lasix held. May be able to restart these as her renal function stabilizes  Atrial fibrillation with periods of RVR. Consider starting rate control medication, metoprolol.  Depression/Chronic Pain/Insomnia:  - Continue home meds   DVT prophylaxis: SCDS Code Status: Full Family Communication:CM updated family concerning barriers to flight transfer. Family cannot pay out of pocket. Awaiting insurance auth. Disposition Plan: Awaiting insurance authorization to transfer to [facility] Barriers:  Patient was traveling out of home state. Family would like patient transferred to another hospital [facility], [place], [place]). Attending, Dr. Delton Coombes spoke with transfer center @ [facility].Case managementrequested letter written by Attending. - Letter complete.Awaiting insurance authorization fortransport to [facility].  Discharge Diagnoses:  Active Problems:   Subdural hematoma (HCC)   Aggressive behavior   Encephalopathy   Intracranial bleed  [facility])    Discharge Instructions   Allergies as of 12/26/2019      Reactions   Chlorhexidine Rash   Per daughter's report   Codeine Rash      Medication List    ASK your doctor about these medications   Adcirca 20 MG tablet Generic drug: tadalafil (PAH) Take 40 mg by mouth at bedtime.   bosentan 125 MG tablet Commonly known as: TRACLEER Take 125 mg by mouth 2 (two) times daily.   busPIRone 10 MG tablet Commonly known as: BUSPAR Take 10 mg by mouth 2 (two) times daily.   cholecalciferol 25 MCG (1000 UNIT) tablet Commonly known as: VITAMIN D3 Take 1,000 Units by mouth daily.   dicyclomine 10 MG capsule Commonly known as: BENTYL Take 10 mg by mouth 4 (four) times daily -  before meals and at bedtime.   diphenoxylate-atropine 2.5-0.025 MG tablet Commonly known as: LOMOTIL Take 1 tablet by mouth 4 (four) times daily as needed for diarrhea or loose stools.   ferrous sulfate 325 (65 FE) MG tablet Take 325 mg by mouth daily with breakfast.   furosemide 80 MG tablet Commonly known as: LASIX Take 80 mg by mouth 2 (two) times daily.   gabapentin 800 MG tablet Commonly known as: NEURONTIN Take 800 mg by mouth 3 (three) times daily.   hydrocortisone-pramoxine rectal foam Commonly known as: PROCTOFOAM-HC Place 1 applicator rectally 2 (two) times daily.   lactobacillus acidophilus Tabs tablet Take 2  tablets by mouth 2 (two) times daily.   LORazepam 1 MG tablet Commonly known as: ATIVAN Take 1 mg by mouth 2 (two) times daily as needed for anxiety.   magnesium gluconate 500 MG tablet Commonly known as: MAGONATE Take 500 mg by mouth 2 (two) times daily.   multivitamin with minerals tablet Take 1 tablet by mouth daily.   OXYGEN Inhale 3 L into the lungs continuous.   potassium chloride 10 MEQ tablet Commonly known as: KLOR-CON Take 40 mEq by mouth 2 (two) times daily.   sodium bicarbonate 650 MG tablet Take 650 mg by mouth 2 (two) times daily.    spironolactone 100 MG tablet Commonly known as: ALDACTONE Take 100 mg by mouth 2 (two) times daily.   temazepam 30 MG capsule Commonly known as: RESTORIL Take 30 mg by mouth at bedtime.   Veletri 1.5 MG injection Generic drug: epoprostenol Inject 6,000 mcg into the vein continuous. 60,000 ng/ml in solution infused over 24 hours. ( 4 vials per cassette)   warfarin 10 MG tablet Commonly known as: COUMADIN Take 10 mg by mouth daily.       Allergies  Allergen Reactions  . Chlorhexidine Rash    Per daughter's report  . Codeine Rash    Consultations:  Neurosurgery  Pulmonology   Procedures/Studies: CT HEAD WO CONTRAST  Result Date: 12/23/2019 CLINICAL DATA:  Stroke follow-up EXAM: CT HEAD WITHOUT CONTRAST TECHNIQUE: Contiguous axial images were obtained from the base of the skull through the vertex without intravenous contrast. COMPARISON:  Head CT 12/21/2019 FINDINGS: Brain: Large left hemisphere mixed intra- and extra-axial hemorrhage is unchanged. The left temporal intra-axial component again measures 3.0 cm in AP dimension. There is 5 mm of rightward midline shift. Unchanged edema within the left temporal white matter. No hydrocephalus. No new site of hemorrhage. Persistent small amount blood along the anterior falx cerebri. Vascular: No abnormal hyperdensity of the major intracranial arteries or dural venous sinuses. No intracranial atherosclerosis. Skull: The visualized skull base, calvarium and extracranial soft tissues are normal. Sinuses/Orbits: No fluid levels or advanced mucosal thickening of the visualized paranasal sinuses. No mastoid or middle ear effusion. The orbits are normal. IMPRESSION: 1. Unchanged size of large left hemisphere mixed intra- and extra-axial hemorrhage with 5 mm of rightward midline shift. 2. No new site of hemorrhage. Electronically Signed   By: Deatra Robinson M.D.   On: 12/23/2019 03:24   CT Head Wo Contrast  Result Date:  12/21/2019 CLINICAL DATA:  Follow-up subdural hematoma EXAM: CT HEAD WITHOUT CONTRAST TECHNIQUE: Contiguous axial images were obtained from the base of the skull through the vertex without intravenous contrast. COMPARISON:  Two days ago FINDINGS: Brain: Parenchymal hemorrhage with surrounding edema in the left posterior temporal lobe, unchanged at 15 x 23 mm on coronal reformats. There is subdural hematoma along the left cerebral convexity greatest laterally but also seen along the tentorium and interhemispheric fissure. At the level of the contusion the subdural hemorrhage measures up to 2 cm in thickness. Midline shift measures 4 mm. No entrapment or infarct. Vascular: Negative Skull: No acute finding.  Remote left frontal burr hole. Sinuses/Orbits: Negative IMPRESSION: Unchanged left temporal lobe contusion and left cerebral convexity subdural hemorrhage. Electronically Signed   By: Marnee Spring M.D.   On: 12/21/2019 06:33   CT HEAD WO CONTRAST  Result Date: 12/19/2019 CLINICAL DATA:  64 year old female status post trauma with intracranial hemorrhage. EXAM: CT HEAD WITHOUT CONTRAST TECHNIQUE: Contiguous axial images were obtained from the  base of the skull through the vertex without intravenous contrast. COMPARISON:  Noncontrast head CT 0951 hours today. FINDINGS: Brain: Wide ranging hyperdense left side subdural hematoma is generally 5-7 mm thickness along the left hemisphere, although focal lobulations of blood along the left temporal lobe and operculum are up to 20 mm and stable. Small volume left para falcine subdural blood with both hyperdense and hypodense components appears stable. Superimposed posterior left temporal lobe hemorrhagic contusion with surrounding edema is stable (series 3, image 15). There could be additional smaller hemorrhagic contusion in the left anterior temporal lobe. Rightward midline shift is stable at 7 mm. Stable effaced left lateral ventricle. No ventricular trapping.  Stable blood along the left tentorium. Basilar cisterns are patent and stable. No new or progressive intracranial hemorrhage identified. No acute cortically based infarct. Vascular: Mild Calcified atherosclerosis at the skull base. Skull: Chronic left superior frontal convexity burr hole. No acute osseous abnormality identified. Sinuses/Orbits: Mild left mastoid effusion appears stable and the left tympanic cavity remains clear. Smaller right inferior mastoid effusion, also stable with clear right tympanic cavity. Mild paranasal sinus mucosal thickening. Other: No acute orbit or scalp soft tissue findings. IMPRESSION: 1. Stable extensive left side subdural hematoma, lobulated and ranging from 7 mm to 20 mm in thickness. 2. Stable left temporal lobe hemorrhagic contusion. Stable small volume left para falcine subdural blood. 3. Stable intracranial mass effect with rightward midline shift at 7 mm. 4. No new intracranial abnormality. 5. Mild bilateral mastoid effusions. No acute skull fracture identified. Electronically Signed   By: Odessa Fleming M.D.   On: 12/19/2019 15:29   CT Head Wo Contrast  Result Date: 12/19/2019 CLINICAL DATA:  Encephalopathy.  Trauma. EXAM: CT HEAD WITHOUT CONTRAST CT MAXILLOFACIAL WITHOUT CONTRAST CT CERVICAL SPINE WITHOUT CONTRAST TECHNIQUE: Multidetector CT imaging of the head, cervical spine, and maxillofacial structures were performed using the standard protocol without intravenous contrast. Multiplanar CT image reconstructions of the cervical spine and maxillofacial structures were also generated. COMPARISON:  None. FINDINGS: CT HEAD FINDINGS Brain: Examination demonstrates an acute left subdural hematoma most prominent over the temporal region rib there is a focal bulbous area measuring approximately 2 cm in thickness. This focal masslike area of acute subdural hemorrhage appears to communicate with a parenchymal component of acute hemorrhage over the posterior left temporal region  measuring 2.2 x 3.1 cm. There is a component of acute subdural hemorrhage over the left side of the anterior interhemispheric fissure. This also a component of acute subarachnoid hemorrhage over the left tentorium. There is mild local mass effect over the left hemisphere with midline shift to the right of approximately 5-6 mm. Possible mild associated white matter edema over the left parietal/posterior temporal region. Subtle focal areas of acute subarachnoid hemorrhage over the left temporal region. Small lower density subdural fluid collection along the high left falx measuring 6-7 mm in thickness. Vascular: No hyperdense vessel or unexpected calcification. Skull: Normal. Negative for fracture or focal lesion. Oval focal postsurgical defect over the left frontal parietal skull. Other: None. CT MAXILLOFACIAL FINDINGS Osseous: No evidence of facial bone fracture. Orbits: Negative. No traumatic or inflammatory finding. Sinuses: Paranasal sinuses are well developed and well aerated without air-fluid level. Subtle focal opacification over the right ethmoid air cells and small mucous retention cyst over the anterior floor the left maxillary sinus. Ostiomeatal complexes are patent. Visualized mastoid air cells are clear. Soft tissues: No focal soft tissue injury. CT CERVICAL SPINE FINDINGS Alignment: Normal. Skull base and vertebrae: Vertebral  body heights are normal. Minimal spondylosis throughout the cervical spine. Atlantoaxial articulation is unremarkable. No evidence of acute fracture or traumatic subluxation. Soft tissues and spinal canal: No prevertebral fluid or swelling. No visible canal hematoma. Disc levels:  No significant disc space narrowing. Upper chest: Enlarged heterogeneous goiter. Other: None. IMPRESSION: 1. Moderate acute subdural hematoma over the left convexity most prominent over the temporal region with there is focal bulbous prominence of this hematoma measuring 2 cm in thickness. This subdural  hematoma appears to communicate with an area of acute parenchymal hemorrhage over the left temporal lobe measuring 2.2 x 3.1 cm. There is minimal associated acute subarachnoid hemorrhage over the left temporal region. Small amount of acute subdural hemorrhage over the anterior interhemispheric fissure as well as left tentorium. Local mass effect over the left convexity and mild midline shift to the right of 5-6 mm. 2. 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 Postsurgical defect over the left frontal parietal skull. 3.  No acute facial bone fracture. 4.  No acute cervical spine injury. 5. Enlarged heterogeneous thyroid likely goiter. Recommend thyroid ultrasound (ref: J Am Coll Radiol. 2015 Feb;12(2): 143-50). Critical Value/emergent results were called by telephone at the time of interpretation on 12/19/2019 at 11:08 am to provider Dr. Barbie Banner, who verbally acknowledged these results. Electronically Signed   By: Elberta Fortis M.D.   On: 12/19/2019 11:09   CT Cervical Spine Wo Contrast  Result Date: 12/19/2019 CLINICAL DATA:  Encephalopathy.  Trauma. EXAM: CT HEAD WITHOUT CONTRAST CT MAXILLOFACIAL WITHOUT CONTRAST CT CERVICAL SPINE WITHOUT CONTRAST TECHNIQUE: Multidetector CT imaging of the head, cervical spine, and maxillofacial structures were performed using the standard protocol without intravenous contrast. Multiplanar CT image reconstructions of the cervical spine and maxillofacial structures were also generated. COMPARISON:  None. FINDINGS: CT HEAD FINDINGS Brain: Examination demonstrates an acute left subdural hematoma most prominent over the temporal region rib there is a focal bulbous area measuring approximately 2 cm in thickness. This focal masslike area of acute subdural hemorrhage appears to communicate with a parenchymal component of acute hemorrhage over the posterior left temporal region measuring 2.2 x 3.1 cm. There  is a component of acute subdural hemorrhage over the left side of the anterior interhemispheric fissure. This also a component of acute subarachnoid hemorrhage over the left tentorium. There is mild local mass effect over the left hemisphere with midline shift to the right of approximately 5-6 mm. Possible mild associated white matter edema over the left parietal/posterior temporal region. Subtle focal areas of acute subarachnoid hemorrhage over the left temporal region. Small lower density subdural fluid collection along the high left falx measuring 6-7 mm in thickness. Vascular: No hyperdense vessel or unexpected calcification. Skull: Normal. Negative for fracture or focal lesion. Oval focal postsurgical defect over the left frontal parietal skull. Other: None. CT MAXILLOFACIAL FINDINGS Osseous: No evidence of facial bone fracture. Orbits: Negative. No traumatic or inflammatory finding. Sinuses: Paranasal sinuses are well developed and well aerated without air-fluid level. Subtle focal opacification over the right ethmoid air cells and small mucous retention cyst over the anterior floor the left maxillary sinus. Ostiomeatal complexes are patent. Visualized mastoid air cells are clear. Soft tissues: No focal soft tissue injury. CT CERVICAL SPINE FINDINGS Alignment: Normal. Skull base and vertebrae: Vertebral body heights are normal. Minimal spondylosis throughout the cervical spine. Atlantoaxial articulation is unremarkable. No evidence of acute fracture or traumatic subluxation. Soft tissues and spinal canal: No prevertebral  fluid or swelling. No visible canal hematoma. Disc levels:  No significant disc space narrowing. Upper chest: Enlarged heterogeneous goiter. Other: None. IMPRESSION: 1. Moderate acute subdural hematoma over the left convexity most prominent over the temporal region with there is focal bulbous prominence of this hematoma measuring 2 cm in thickness. This subdural hematoma appears to communicate  with an area of acute parenchymal hemorrhage over the left temporal lobe measuring 2.2 x 3.1 cm. There is minimal associated acute subarachnoid hemorrhage over the left temporal region. Small amount of acute subdural hemorrhage over the anterior interhemispheric fissure as well as left tentorium. Local mass effect over the left convexity and mild midline shift to the right of 5-6 mm. 2. 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 Postsurgical defect over the left frontal parietal skull. 3.  No acute facial bone fracture. 4.  No acute cervical spine injury. 5. Enlarged heterogeneous thyroid likely goiter. Recommend thyroid ultrasound (ref: J Am Coll Radiol. 2015 Feb;12(2): 143-50). Critical Value/emergent results were called by telephone at the time of interpretation on 12/19/2019 at 11:08 am to provider Dr. Barbie Banner, who verbally acknowledged these results. Electronically Signed   By: Elberta Fortis M.D.   On: 12/19/2019 11:09   DG Chest Port 1 View  Result Date: 12/24/2019 CLINICAL DATA:  Abnormal respirations.  Subdural hematoma. EXAM: PORTABLE CHEST 1 VIEW COMPARISON:  Chest x-ray 12/19/2019. FINDINGS: Patient is rotated. PowerPort catheter stable position. Mediastinum and hilar structures appear unremarkable. Previously identified nodular opacity noted over the right hilum no longer visualized. This is most likely vascular. Heart size stable. No focal infiltrate. No pleural effusion or pneumothorax. IMPRESSION: 1.  PowerPort catheter stable position. 2. No acute cardiopulmonary disease. Previously identified nodular opacity over the right hilum no longer identified. This was most likely vascular. Electronically Signed   By: Maisie Fus  Register   On: 12/24/2019 06:43   DG Chest Portable 1 View  Result Date: 12/19/2019 CLINICAL DATA:  Initial evaluation for acute altered mental status. EXAM: PORTABLE CHEST 1 VIEW COMPARISON:  Prior  radiograph from 08/02/2008. FINDINGS: Patient rotated to the left. Left-sided Port-A-Cath in place with tip overlying the cavoatrial junction. Mild cardiomegaly. Mediastinal silhouette normal. Aortic atherosclerosis. Lungs hypoinflated. 19 mm nodular density seen overlying the right infrahilar region, suspected reflect a portion of the right pulmonary arteries, although underlying nodule not excluded. No focal infiltrates. No edema or effusion. No pneumothorax. No acute osseous finding. IMPRESSION: 1. 19 mm nodular density overlying the right infrahilar region, suspected to be vascular in nature related to the underlying right pulmonary arteries. A possible underlying nodule/mass could also have this appearance. Further assessment with dedicated cross-sectional imaging of the chest suggested for further evaluation. 2. Mild cardiomegaly. No other active cardiopulmonary disease. Electronically Signed   By: Rise Mu M.D.   On: 12/19/2019 06:25   DG Shoulder Left Portable  Result Date: 12/19/2019 CLINICAL DATA:  Bruising to shoulder EXAM: LEFT SHOULDER COMPARISON:  None. FINDINGS: Alignment is anatomic on this single view. There is no acute fracture. Joint space is preserved. Chest findings dictated separately. IMPRESSION: Negative. Electronically Signed   By: Guadlupe Spanish M.D.   On: 12/19/2019 07:06   DG Shoulder Right Portable  Result Date: 12/19/2019 CLINICAL DATA:  Shoulder bruising EXAM: PORTABLE RIGHT SHOULDER COMPARISON:  None. FINDINGS: There is alignment at the right glenohumeral joint on this single view. No acute fracture. Deformity and remodeling of the mid right humerus secondary to prior  fracture. Joint space appears preserved. Chest findings dictated separately. IMPRESSION: No acute osseous abnormality. Electronically Signed   By: Guadlupe Spanish M.D.   On: 12/19/2019 07:07   ECHOCARDIOGRAM COMPLETE  Result Date: 12/22/2019    ECHOCARDIOGRAM LIMITED REPORT   Patient Name:   HANA TRIPPETT Date of Exam: 12/22/2019 Medical Rec #:  [id number]       Height:       68.0 in Accession #:    [id number]      Weight:       170.2 lb Date of Birth:  13-Oct-1955       BSA:          1.908 m Patient Age:    63 years        BP:           92/61 mmHg Patient Gender: F               HR:           98 bpm. Exam Location:  Inpatient Procedure: 2D Echo Indications:    Congestive Heart Failure I50.9  History:        Patient has no prior history of Echocardiogram examinations.  Sonographer:    Thurman Coyer RDCS (AE) Referring Phys: [id number] ROBERT S BYRUM IMPRESSIONS  1. Left ventricular ejection fraction, by estimation, is 60 to 65%. The left ventricle has normal function. The left ventricle has no regional wall motion abnormalities. Left ventricular diastolic parameters were normal.  2. Right ventricular systolic function is normal. The right ventricular size is normal. There is normal pulmonary artery systolic pressure.  3. Left atrial size was mildly dilated.  4. The mitral valve is normal in structure. No evidence of mitral valve regurgitation. No evidence of mitral stenosis.  5. The aortic valve is normal in structure. Aortic valve regurgitation is not visualized. No aortic stenosis is present.  6. The inferior vena cava is normal in size with greater than 50% respiratory variability, suggesting right atrial pressure of 3 mmHg. FINDINGS  Left Ventricle: Left ventricular ejection fraction, by estimation, is 60 to 65%. The left ventricle has normal function. The left ventricle has no regional wall motion abnormalities. The left ventricular internal cavity size was normal in size. There is  no left ventricular hypertrophy. Normal left ventricular filling pressure. Right Ventricle: The right ventricular size is normal. No increase in right ventricular wall thickness. Right ventricular systolic function is normal. There is normal pulmonary artery systolic pressure. The tricuspid regurgitant velocity is 2.24 m/s, and   with an assumed right atrial pressure of 3 mmHg, the estimated right ventricular systolic pressure is 23.1 mmHg. Left Atrium: Left atrial size was mildly dilated. Right Atrium: Right atrial size was normal in size. Pericardium: There is no evidence of pericardial effusion. Mitral Valve: The mitral valve is normal in structure. Normal mobility of the mitral valve leaflets. No evidence of mitral valve stenosis. Tricuspid Valve: The tricuspid valve is normal in structure. Tricuspid valve regurgitation is trivial. No evidence of tricuspid stenosis. Aortic Valve: The aortic valve is normal in structure. Aortic valve regurgitation is not visualized. No aortic stenosis is present. Pulmonic Valve: The pulmonic valve was normal in structure. Pulmonic valve regurgitation is not visualized. No evidence of pulmonic stenosis. Aorta: The aortic root is normal in size and structure. Venous: The inferior vena cava is normal in size with greater than 50% respiratory variability, suggesting right atrial pressure of 3 mmHg. IAS/Shunts: No atrial level shunt detected  by color flow Doppler.  LEFT VENTRICLE PLAX 2D LVIDd:         5.37 cm  Diastology LVIDs:         3.26 cm  LV e' lateral:   10.40 cm/s LV PW:         0.93 cm  LV E/e' lateral: 4.1 LV IVS:        0.79 cm  LV e' medial:    6.53 cm/s LVOT diam:     2.30 cm  LV E/e' medial:  6.6 LV SV:         59 LV SV Index:   31 LVOT Area:     4.15 cm  RIGHT VENTRICLE RV S prime:     16.60 cm/s TAPSE (M-mode): 1.6 cm LEFT ATRIUM             Index       RIGHT ATRIUM           Index LA diam:        3.80 cm 1.99 cm/m  RA Area:     13.70 cm LA Vol (A2C):   32.8 ml 17.19 ml/m RA Volume:   31.10 ml  16.30 ml/m LA Vol (A4C):   30.6 ml 16.04 ml/m LA Biplane Vol: 35.1 ml 18.39 ml/m  AORTIC VALVE LVOT Vmax:   85.80 cm/s LVOT Vmean:  52.300 cm/s LVOT VTI:    0.142 m  AORTA Ao Root diam: 3.40 cm MITRAL VALVE               TRICUSPID VALVE MV Area (PHT): 2.87 cm    TR Peak grad:   20.1 mmHg MV  Decel Time: 264 msec    TR Vmax:        224.00 cm/s MV E velocity: 42.80 cm/s MV A velocity: 49.40 cm/s  SHUNTS MV E/A ratio:  0.87        Systemic VTI:  0.14 m                            Systemic Diam: 2.30 cm Rachelle HoraMihai Croitoru MD Electronically signed by Thurmon FairMihai Croitoru MD Signature Date/Time: 12/22/2019/12:39:00 PM    Final    CT Maxillofacial Wo Contrast  Result Date: 12/19/2019 CLINICAL DATA:  Encephalopathy.  Trauma. EXAM: CT HEAD WITHOUT CONTRAST CT MAXILLOFACIAL WITHOUT CONTRAST CT CERVICAL SPINE WITHOUT CONTRAST TECHNIQUE: Multidetector CT imaging of the head, cervical spine, and maxillofacial structures were performed using the standard protocol without intravenous contrast. Multiplanar CT image reconstructions of the cervical spine and maxillofacial structures were also generated. COMPARISON:  None. FINDINGS: CT HEAD FINDINGS Brain: Examination demonstrates an acute left subdural hematoma most prominent over the temporal region rib there is a focal bulbous area measuring approximately 2 cm in thickness. This focal masslike area of acute subdural hemorrhage appears to communicate with a parenchymal component of acute hemorrhage over the posterior left temporal region measuring 2.2 x 3.1 cm. There is a component of acute subdural hemorrhage over the left side of the anterior interhemispheric fissure. This also a component of acute subarachnoid hemorrhage over the left tentorium. There is mild local mass effect over the left hemisphere with midline shift to the right of approximately 5-6 mm. Possible mild associated white matter edema over the left parietal/posterior temporal region. Subtle focal areas of acute subarachnoid hemorrhage over the left temporal region. Small lower density subdural fluid collection along the high left falx measuring 6-7 mm in thickness. Vascular:  No hyperdense vessel or unexpected calcification. Skull: Normal. Negative for fracture or focal lesion. Oval focal postsurgical defect  over the left frontal parietal skull. Other: None. CT MAXILLOFACIAL FINDINGS Osseous: No evidence of facial bone fracture. Orbits: Negative. No traumatic or inflammatory finding. Sinuses: Paranasal sinuses are well developed and well aerated without air-fluid level. Subtle focal opacification over the right ethmoid air cells and small mucous retention cyst over the anterior floor the left maxillary sinus. Ostiomeatal complexes are patent. Visualized mastoid air cells are clear. Soft tissues: No focal soft tissue injury. CT CERVICAL SPINE FINDINGS Alignment: Normal. Skull base and vertebrae: Vertebral body heights are normal. Minimal spondylosis throughout the cervical spine. Atlantoaxial articulation is unremarkable. No evidence of acute fracture or traumatic subluxation. Soft tissues and spinal canal: No prevertebral fluid or swelling. No visible canal hematoma. Disc levels:  No significant disc space narrowing. Upper chest: Enlarged heterogeneous goiter. Other: None. IMPRESSION: 1. Moderate acute subdural hematoma over the left convexity most prominent over the temporal region with there is focal bulbous prominence of this hematoma measuring 2 cm in thickness. This subdural hematoma appears to communicate with an area of acute parenchymal hemorrhage over the left temporal lobe measuring 2.2 x 3.1 cm. There is minimal associated acute subarachnoid hemorrhage over the left temporal region. Small amount of acute subdural hemorrhage over the anterior interhemispheric fissure as well as left tentorium. Local mass effect over the left convexity and mild midline shift to the right of 5-6 mm. 2. 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 Postsurgical defect over the left frontal parietal skull. 3.  No acute facial bone fracture. 4.  No acute cervical spine injury. 5. Enlarged heterogeneous thyroid likely goiter. Recommend thyroid ultrasound  (ref: J Am Coll Radiol. 2015 Feb;12(2): 143-50). Critical Value/emergent results were called by telephone at the time of interpretation on 12/19/2019 at 11:08 am to provider Dr. Barbie Banner, who verbally acknowledged these results. Electronically Signed   By: Elberta Fortis M.D.   On: 12/19/2019 11:09      Subjective: Patient was seen and examined at bedside.  Patient is refusing all medical care.  Refusing IV line,  does not take any medication orally.  Discharge Exam: Vitals:   12/26/19 0700 12/26/19 0800  BP: 93/68 108/74  Pulse:    Resp: 16 19  Temp:  99 F (37.2 C)  SpO2:  92%   Vitals:   12/26/19 0500 12/26/19 0600 12/26/19 0700 12/26/19 0800  BP: 109/63 104/66 93/68 108/74  Pulse:      Resp:  19 16 19   Temp:    99 F (37.2 C)  TempSrc:    Oral  SpO2:    92%  Weight: 76.5 kg     Height:        General: Pt is alert, awake, not in acute distress Cardiovascular: RRR, S1/S2 +, no rubs, no gallops Respiratory: CTA bilaterally, no wheezing, no rhonchi Abdominal: Soft, NT, ND, bowel sounds + Extremities: no edema, no cyanosis    The results of significant diagnostics from this hospitalization (including imaging, microbiology, ancillary and laboratory) are listed below for reference.     Microbiology: Recent Results (from the past 240 hour(s))  Respiratory Panel by RT PCR (Flu A&B, Covid) - Nasopharyngeal Swab     Status: None   Collection Time: 12/19/19  8:17 AM   Specimen: Nasopharyngeal Swab  Result Value Ref Range Status   SARS Coronavirus 2 by RT PCR NEGATIVE  NEGATIVE Final    Comment: (NOTE) SARS-CoV-2 target nucleic acids are NOT DETECTED. The SARS-CoV-2 RNA is generally detectable in upper respiratoy specimens during the acute phase of infection. The lowest concentration of SARS-CoV-2 viral copies this assay can detect is 131 copies/mL. A negative result does not preclude SARS-Cov-2 infection and should not be used as the sole basis for treatment or other  patient management decisions. A negative result may occur with  improper specimen collection/handling, submission of specimen other than nasopharyngeal swab, presence of viral mutation(s) within the areas targeted by this assay, and inadequate number of viral copies (<131 copies/mL). A negative result must be combined with clinical observations, patient history, and epidemiological information. The expected result is Negative. Fact Sheet for Patients:  https://www.moore.com/ Fact Sheet for Healthcare Providers:  https://www.young.biz/ This test is not yet ap proved or cleared by the [place] FDA and  has been authorized for detection and/or diagnosis of SARS-CoV-2 by FDA under an Emergency Use Authorization (EUA). This EUA will remain  in effect (meaning this test can be used) for the duration of the COVID-19 declaration under Section 564(b)(1) of the Act, 21 U.S.C. section 360bbb-3(b)(1), unless the authorization is terminated or revoked sooner.    Influenza A by PCR NEGATIVE NEGATIVE Final   Influenza B by PCR NEGATIVE NEGATIVE Final    Comment: (NOTE) The Xpert Xpress SARS-CoV-2/FLU/RSV assay is intended as an aid in  the diagnosis of influenza from Nasopharyngeal swab specimens and  should not be used as a sole basis for treatment. Nasal washings and  aspirates are unacceptable for Xpert Xpress SARS-CoV-2/FLU/RSV  testing. Fact Sheet for Patients: https://www.moore.com/ Fact Sheet for Healthcare Providers: https://www.young.biz/ This test is not yet approved or cleared by the [place] FDA and  has been authorized for detection and/or diagnosis of SARS-CoV-2 by  FDA under an Emergency Use Authorization (EUA). This EUA will remain  in effect (meaning this test can be used) for the duration of the  Covid-19 declaration under Section 564(b)(1) of the Act, 21  U.S.C. section 360bbb-3(b)(1), unless  the authorization is  terminated or revoked. Performed at [facility], [address], [place], [place] [address]   MRSA PCR Screening     Status: None   Collection Time: 12/19/19  2:00 PM   Specimen: Nasopharyngeal  Result Value Ref Range Status   MRSA by PCR NEGATIVE NEGATIVE Final    Comment:        The GeneXpert MRSA Assay (FDA approved for NASAL specimens only), is one component of a comprehensive MRSA colonization surveillance program. It is not intended to diagnose MRSA infection nor to guide or monitor treatment for MRSA infections. Performed at [facility], [address], [place], [place] [address]   C Difficile Quick Screen w PCR reflex     Status: None   Collection Time: 12/19/19  4:18 PM   Specimen: STOOL  Result Value Ref Range Status   C Diff antigen NEGATIVE NEGATIVE Final   C Diff toxin NEGATIVE NEGATIVE Final   C Diff interpretation No C. difficile detected.  Final     Labs: BNP (last 3 results) No results for input(s): BNP in the last 8760 hours. Basic Metabolic Panel: Recent Labs  Lab 12/22/19 2354 12/22/19 2354 12/23/19 0981 12/23/19 1914 12/23/19 0836 12/23/19 1240 12/23/19 1622 12/23/19 2317 12/24/19 1256 12/24/19 2319 12/25/19 0133  NA 146*   < > 147*   < > 146*   < > 149* 146*  149* 146* 146*  K 3.0*   < > 3.4*  --  3.2*  --   --  3.9 3.6  --  3.0*  CL 119*   < > 118*  --  118*  --   --  117* 116*  --  113*  CO2 15*   < > 18*  --  20*  --   --  20* 22  --  19*  GLUCOSE 122*   < > 95  --  99  --   --  118* 111*  --  132*  BUN 12   < > 13  --  13  --   --  9 8  --  6*  CREATININE 1.35*   < > 1.32*  --  1.22*  --   --  1.22* 1.08*  --  1.12*  CALCIUM 10.1   < > 10.1  --  10.0  --   --  9.9 9.8  --  9.6  MG 2.2  --   --   --  2.3  --   --  2.2 2.3  --  2.1  PHOS 2.3*  --   --   --  2.6  --   --  1.8* 2.1*  --  2.3*   < > = values in this interval not displayed.   Liver Function Tests: No results for input(s): AST, ALT,  ALKPHOS, BILITOT, PROT, ALBUMIN in the last 168 hours. No results for input(s): LIPASE, AMYLASE in the last 168 hours. No results for input(s): AMMONIA in the last 168 hours. CBC: Recent Labs  Lab 12/20/19 0528 12/21/19 0248 12/22/19 0620 12/23/19 2317 12/25/19 0133  WBC 3.8* 4.4 7.4 5.6 6.3  NEUTROABS  --   --   --  3.9 4.3  HGB 9.1* 9.0* 11.0* 10.2* 9.8*  HCT 28.6* 28.7* 35.3* 31.6* 31.5*  MCV 76.9* 79.1* 79.0* 76.0* 78.9*  PLT 183 183 269 252 268   Cardiac Enzymes: No results for input(s): CKTOTAL, CKMB, CKMBINDEX, TROPONINI in the last 168 hours. BNP: Invalid input(s): POCBNP CBG: No results for input(s): GLUCAP in the last 168 hours. D-Dimer No results for input(s): DDIMER in the last 72 hours. Hgb A1c No results for input(s): HGBA1C in the last 72 hours. Lipid Profile No results for input(s): CHOL, HDL, LDLCALC, TRIG, CHOLHDL, LDLDIRECT in the last 72 hours. Thyroid function studies No results for input(s): TSH, T4TOTAL, T3FREE, THYROIDAB in the last 72 hours.  Invalid input(s): FREET3 Anemia work up No results for input(s): VITAMINB12, FOLATE, FERRITIN, TIBC, IRON, RETICCTPCT in the last 72 hours. Urinalysis    Component Value Date/Time   COLORURINE YELLOW 12/19/2019 0734   APPEARANCEUR HAZY (A) 12/19/2019 0734   LABSPEC 1.014 12/19/2019 0734   PHURINE 5.0 12/19/2019 0734   GLUCOSEU NEGATIVE 12/19/2019 0734   HGBUR SMALL (A) 12/19/2019 0734   BILIRUBINUR NEGATIVE 12/19/2019 0734   KETONESUR NEGATIVE 12/19/2019 0734   PROTEINUR 100 (A) 12/19/2019 0734   NITRITE NEGATIVE 12/19/2019 0734   LEUKOCYTESUR NEGATIVE 12/19/2019 0734   Sepsis Labs Invalid input(s): PROCALCITONIN,  WBC,  LACTICIDVEN Microbiology Recent Results (from the past 240 hour(s))  Respiratory Panel by RT PCR (Flu A&B, Covid) - Nasopharyngeal Swab     Status: None   Collection Time: 12/19/19  8:17 AM   Specimen: Nasopharyngeal Swab  Result Value Ref Range Status   SARS Coronavirus 2 by RT  PCR NEGATIVE NEGATIVE Final    Comment: (NOTE) SARS-CoV-2 target nucleic acids  are NOT DETECTED. The SARS-CoV-2 RNA is generally detectable in upper respiratoy specimens during the acute phase of infection. The lowest concentration of SARS-CoV-2 viral copies this assay can detect is 131 copies/mL. A negative result does not preclude SARS-Cov-2 infection and should not be used as the sole basis for treatment or other patient management decisions. A negative result may occur with  improper specimen collection/handling, submission of specimen other than nasopharyngeal swab, presence of viral mutation(s) within the areas targeted by this assay, and inadequate number of viral copies (<131 copies/mL). A negative result must be combined with clinical observations, patient history, and epidemiological information. The expected result is Negative. Fact Sheet for Patients:  PinkCheek.be Fact Sheet for Healthcare Providers:  GravelBags.it This test is not yet ap proved or cleared by the [place] FDA and  has been authorized for detection and/or diagnosis of SARS-CoV-2 by FDA under an Emergency Use Authorization (EUA). This EUA will remain  in effect (meaning this test can be used) for the duration of the COVID-19 declaration under Section 564(b)(1) of the Act, 21 U.S.C. section 360bbb-3(b)(1), unless the authorization is terminated or revoked sooner.    Influenza A by PCR NEGATIVE NEGATIVE Final   Influenza B by PCR NEGATIVE NEGATIVE Final    Comment: (NOTE) The Xpert Xpress SARS-CoV-2/FLU/RSV assay is intended as an aid in  the diagnosis of influenza from Nasopharyngeal swab specimens and  should not be used as a sole basis for treatment. Nasal washings and  aspirates are unacceptable for Xpert Xpress SARS-CoV-2/FLU/RSV  testing. Fact Sheet for Patients: PinkCheek.be Fact Sheet for Healthcare  Providers: GravelBags.it This test is not yet approved or cleared by the [place] FDA and  has been authorized for detection and/or diagnosis of SARS-CoV-2 by  FDA under an Emergency Use Authorization (EUA). This EUA will remain  in effect (meaning this test can be used) for the duration of the  Covid-19 declaration under Section 564(b)(1) of the Act, 21  U.S.C. section 360bbb-3(b)(1), unless the authorization is  terminated or revoked. Performed at [facility], [place] [address] [place], [place] [address]   MRSA PCR Screening     Status: None   Collection Time: 12/19/19  2:00 PM   Specimen: Nasopharyngeal  Result Value Ref Range Status   MRSA by PCR NEGATIVE NEGATIVE Final    Comment:        The GeneXpert MRSA Assay (FDA approved for NASAL specimens only), is one component of a comprehensive MRSA colonization surveillance program. It is not intended to diagnose MRSA infection nor to guide or monitor treatment for MRSA infections. Performed at [facility], [place] [address], [place], [place] [address]   C Difficile Quick Screen w PCR reflex     Status: None   Collection Time: 12/19/19  4:18 PM   Specimen: STOOL  Result Value Ref Range Status   C Diff antigen NEGATIVE NEGATIVE Final   C Diff toxin NEGATIVE NEGATIVE Final   C Diff interpretation No C. difficile detected.  Final     Time coordinating discharge: Over 30 minutes  SIGNED:   Shawna Clamp, MD  [facility] 12/26/2019, 3:08 PM Pager   If 7PM-7AM, please contact night-coverage [email or web address]

## 2019-12-26 NOTE — Progress Notes (Signed)
Patient is leaving to go to [facility] this morning and was sent with one epoprostenol (Veletri) cassette and alcohol swabs. Verified cassette dose with patient's daughter.   Gerrit Halls, PharmD PGY1 Pharmacy Resident [employer]: [phone]   Please check AMION for all [facility] numbers  12/26/2019 9:41 AM

## 2021-07-19 IMAGING — CT CT MAXILLOFACIAL W/O CM
3 of 6 series · 13 of 47 positions shown, 15 images · non-contrast
Comparison: None.

CLINICAL DATA: Encephalopathy.  Trauma.

EXAM:
CT HEAD WITHOUT CONTRAST
CT MAXILLOFACIAL WITHOUT CONTRAST
CT CERVICAL SPINE WITHOUT CONTRAST
TECHNIQUE: Multidetector CT imaging of the head, cervical spine, and
maxillofacial structures were performed using the standard protocol
without intravenous contrast. Multiplanar CT image reconstructions
of the cervical spine and maxillofacial structures were also
generated.

[Series 4: maxilllofacial 2.0 hr40 3 · axial · 0.40mm/px · z∈[+994,+1162]mm · 8 of 100 slices shown, 10 images]
[im 8/100  brain]
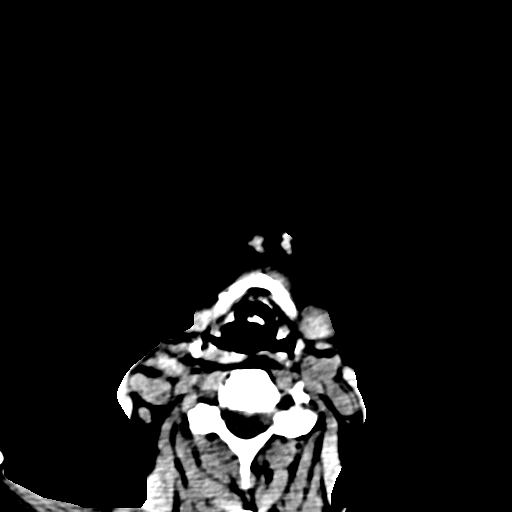
[im 8/100  bone]
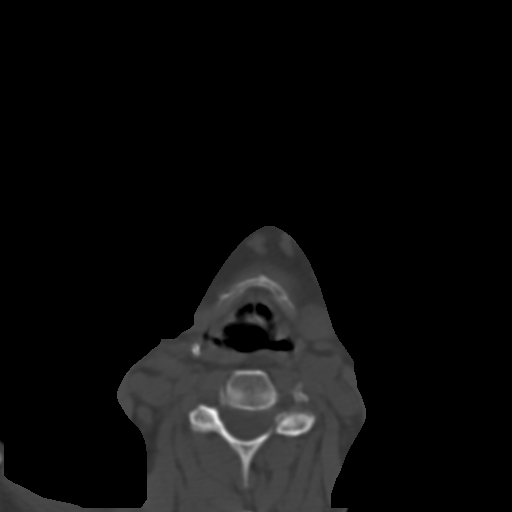
[im 22/100  bone]
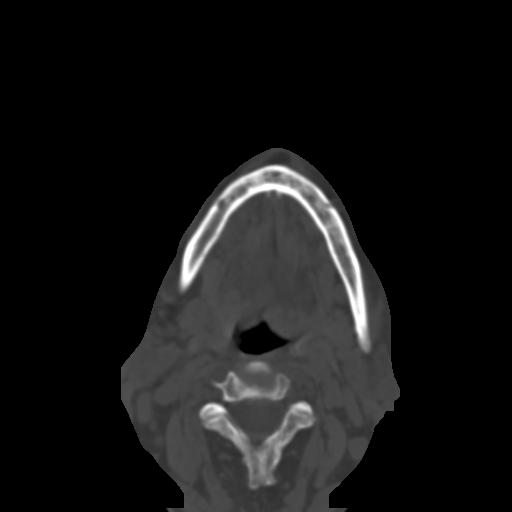
[im 36/100  bone]
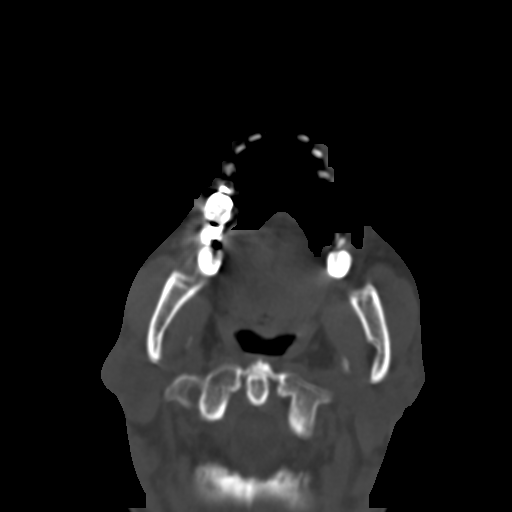
[im 43/100  bone]
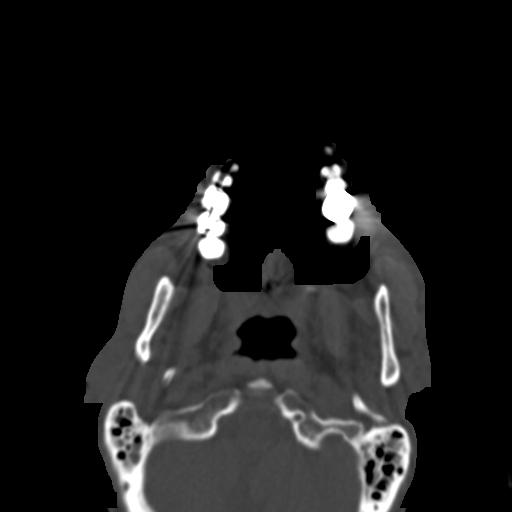
[im 57/100  brain]
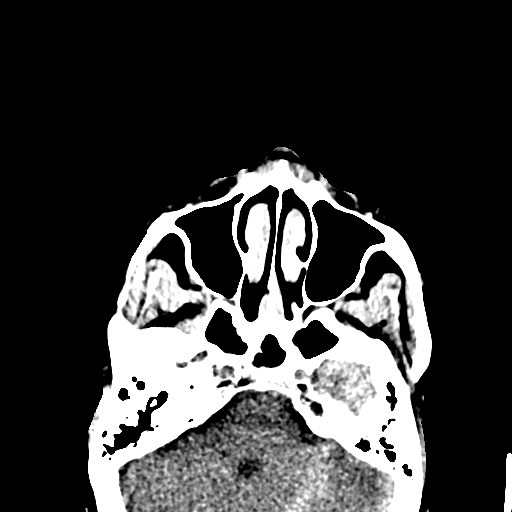
[im 57/100  bone]
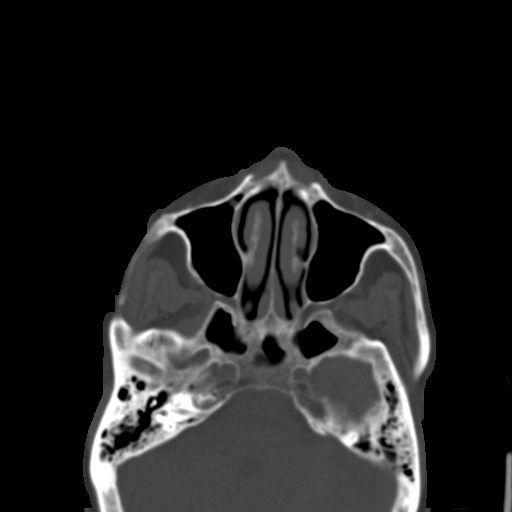
[im 64/100  bone]
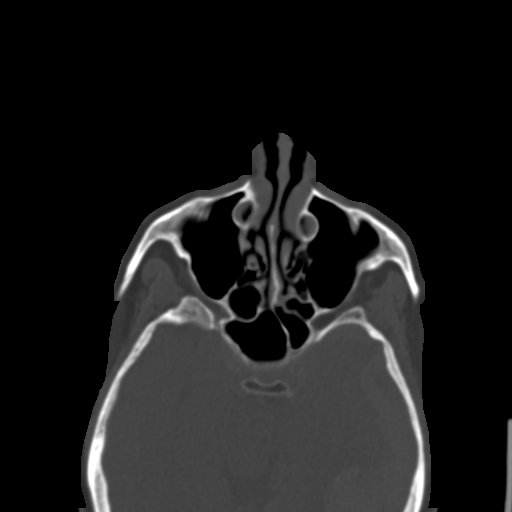
[im 78/100  bone]
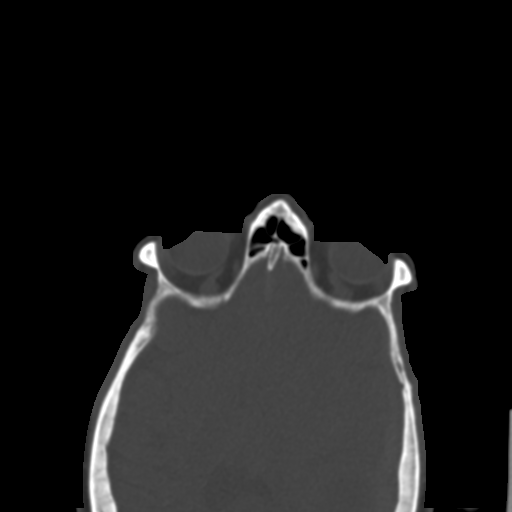
[im 92/100  bone]
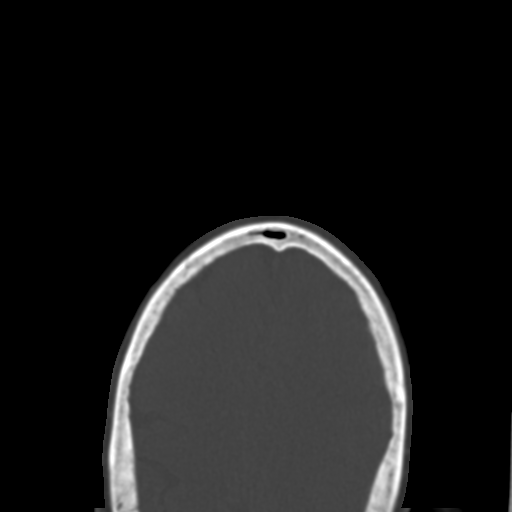

[Series 8: st cor · coronal · 0.35mm/px · 3 of 93 slices shown]
[im 24/93  bone]
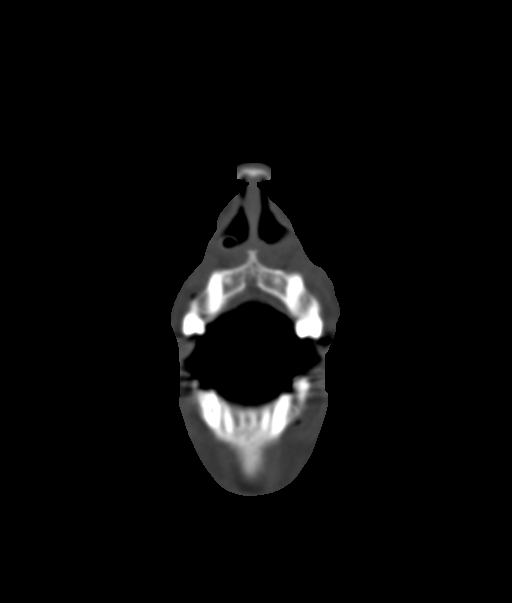
[im 47/93  bone]
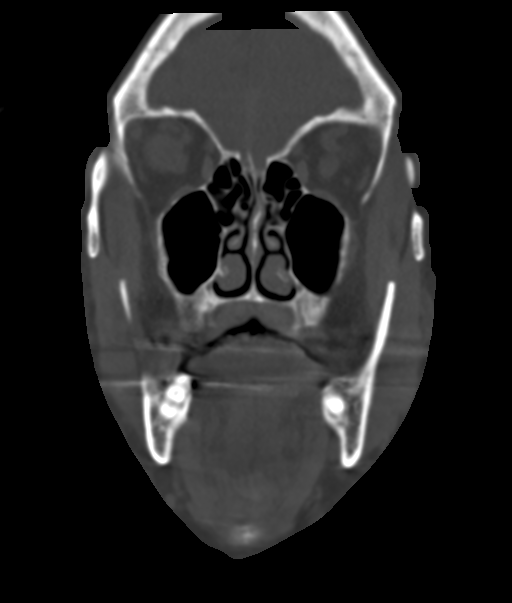
[im 70/93  bone]
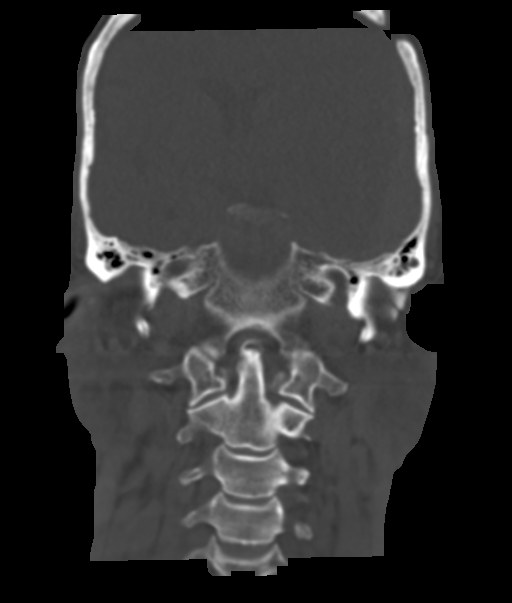

[Series 11: bone sag · sagittal · 0.36mm/px · 2 of 90 slices shown]
[im 30/90  bone]
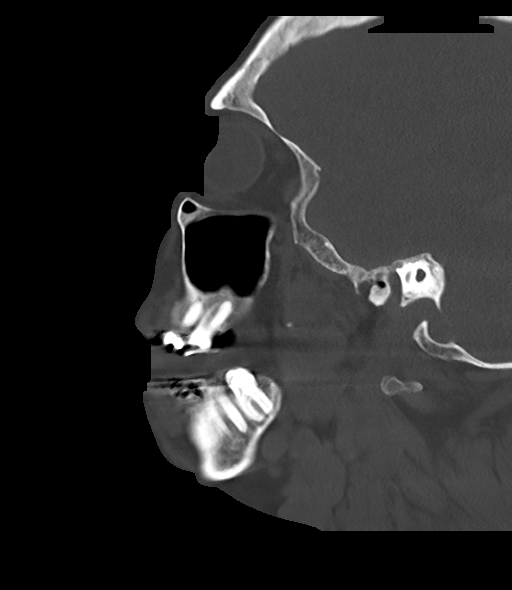
[im 60/90  bone]
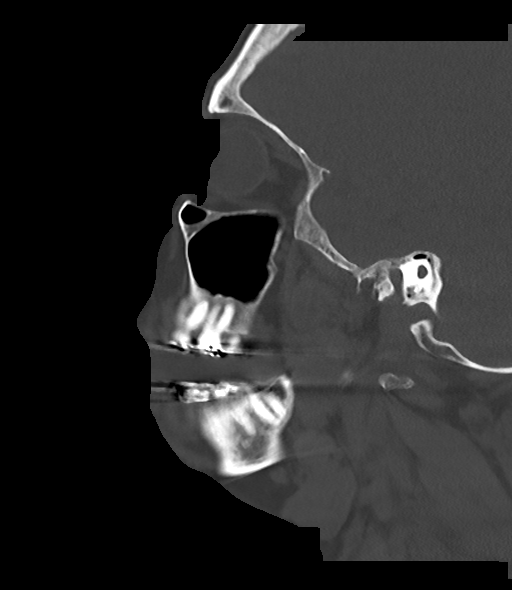

[13 of 47 positions shown; findings below may reference images not displayed]

FINDINGS: CT HEAD FINDINGS

Brain: Examination demonstrates an acute left subdural hematoma most
prominent over the temporal region rib there is a focal bulbous area
measuring approximately 2 cm in thickness. This focal masslike area
of acute subdural hemorrhage appears to communicate with a
parenchymal component of acute hemorrhage over the posterior left
temporal region measuring 2.2 x 3.1 cm. There is a component of
acute subdural hemorrhage over the left side of the anterior
interhemispheric fissure. This also a component of acute
subarachnoid hemorrhage over the left tentorium. There is mild local
mass effect over the left hemisphere with midline shift to the right
of approximately 5-6 mm. Possible mild associated white matter edema
over the left parietal/posterior temporal region. Subtle focal areas
of acute subarachnoid hemorrhage over the left temporal region.
Small lower density subdural fluid collection along the high left
falx measuring 6-7 mm in thickness.

Vascular: No hyperdense vessel or unexpected calcification.

Skull: Normal. Negative for fracture or focal lesion. Oval focal
postsurgical defect over the left frontal parietal skull.

Other: None.

CT MAXILLOFACIAL FINDINGS

Osseous: No evidence of facial bone fracture.

Orbits: Negative. No traumatic or inflammatory finding.

Sinuses: Paranasal sinuses are well developed and well aerated
without air-fluid level. Subtle focal opacification over the right
ethmoid air cells and small mucous retention cyst over the anterior
floor the left maxillary sinus. Ostiomeatal complexes are patent.
Visualized mastoid air cells are clear.

Soft tissues: No focal soft tissue injury.

CT CERVICAL SPINE FINDINGS

Alignment: Normal.

Skull base and vertebrae: Vertebral body heights are normal. Minimal
spondylosis throughout the cervical spine. Atlantoaxial articulation
is unremarkable. No evidence of acute fracture or traumatic
subluxation.

Soft tissues and spinal canal: No prevertebral fluid or swelling. No
visible canal hematoma.

Disc levels:  No significant disc space narrowing.

Upper chest: Enlarged heterogeneous goiter.

Other: None.
IMPRESSION: 1. Moderate acute subdural hematoma over the left convexity most
prominent over the temporal region with there is focal bulbous
prominence of this hematoma measuring 2 cm in thickness. This
subdural hematoma appears to communicate with an area of acute
parenchymal hemorrhage over the left temporal lobe measuring 2.2 x
3.1 cm. There is minimal associated acute subarachnoid hemorrhage
over the left temporal region. Small amount of acute subdural
hemorrhage over the anterior interhemispheric fissure as well as
left tentorium. Local mass effect over the left convexity and mild
midline shift to the right of 5-6 mm.

2. Low-density collection of left subdural fluid over the high
frontoparietal region likely from chronic subdural hematoma.
Postsurgical defect over the left frontal parietal skull.

3.  No acute facial bone fracture.

4.  No acute cervical spine injury.

5. Enlarged heterogeneous thyroid likely goiter. Recommend thyroid
ultrasound (ref: [HOSPITAL]. [DATE]): 143-50).

Critical Value/emergent results were called by telephone at the time
of interpretation on 12/19/2019 at [DATE] to provider Dr.
Lizurej, who verbally acknowledged these results.

## 2021-07-24 IMAGING — DX DG CHEST 1V PORT
1 series · 1 of 1 positions shown · non-contrast
Comparison: Chest x-ray 12/19/2019.

CLINICAL DATA: Abnormal respirations.  Subdural hematoma.

EXAM:
PORTABLE CHEST 1 VIEW

[chest ap]
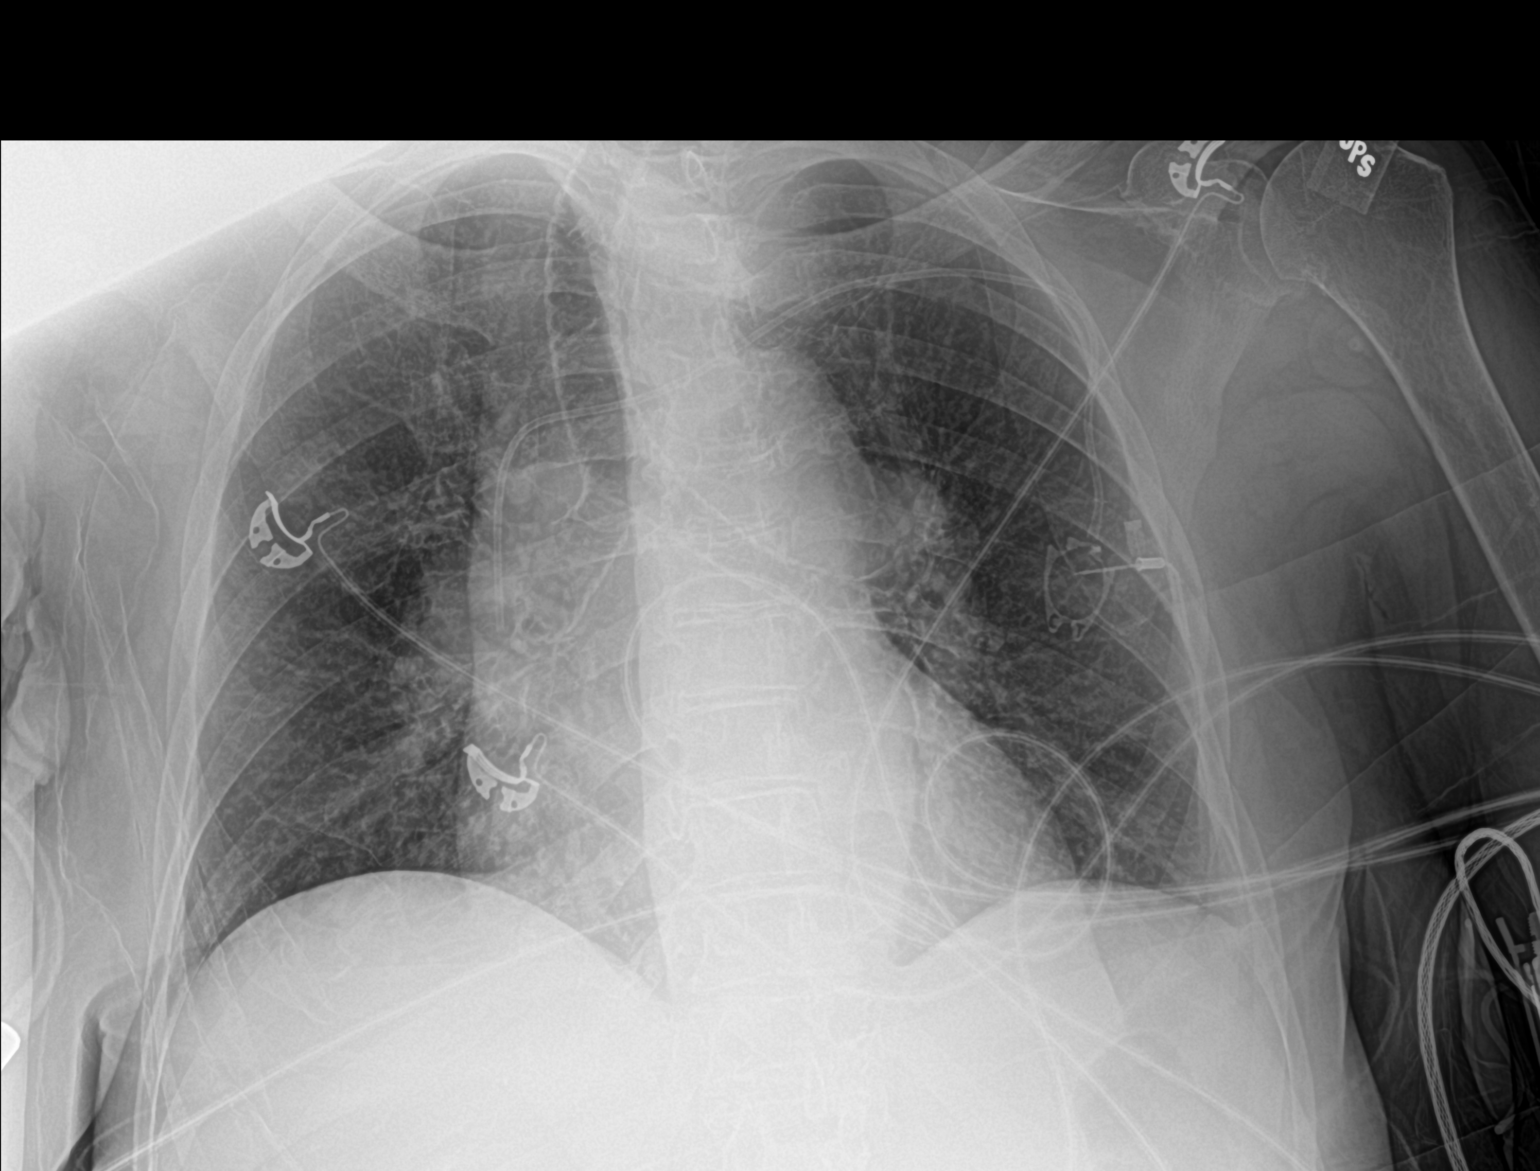

[1 of 1 positions shown; findings below may reference images not displayed]

FINDINGS: Patient is rotated. PowerPort catheter stable position. Mediastinum
and hilar structures appear unremarkable. Previously identified
nodular opacity noted over the right hilum no longer visualized.
This is most likely vascular. Heart size stable. No focal
infiltrate. No pleural effusion or pneumothorax.
IMPRESSION: 1.  PowerPort catheter stable position.

2. No acute cardiopulmonary disease. Previously identified nodular
opacity over the right hilum no longer identified. This was most
likely vascular.
